# Patient Record
Sex: Female | Born: 1935 | Race: White | Hispanic: No | Marital: Married | Smoking: Never smoker
Health system: Southern US, Community
[De-identification: ages and names within clinical notes are randomized; demographics above are authoritative.]

## PROBLEM LIST (undated history)

## (undated) DIAGNOSIS — F32A Depression, unspecified: Secondary | ICD-10-CM

## (undated) DIAGNOSIS — I10 Essential (primary) hypertension: Secondary | ICD-10-CM

## (undated) DIAGNOSIS — F0280 Dementia in other diseases classified elsewhere without behavioral disturbance: Secondary | ICD-10-CM

## (undated) DIAGNOSIS — D649 Anemia, unspecified: Secondary | ICD-10-CM

## (undated) DIAGNOSIS — S72009A Fracture of unspecified part of neck of unspecified femur, initial encounter for closed fracture: Secondary | ICD-10-CM

## (undated) DIAGNOSIS — G309 Alzheimer's disease, unspecified: Secondary | ICD-10-CM

## (undated) DIAGNOSIS — F329 Major depressive disorder, single episode, unspecified: Secondary | ICD-10-CM

## (undated) DIAGNOSIS — E785 Hyperlipidemia, unspecified: Secondary | ICD-10-CM

## (undated) DIAGNOSIS — S72001A Fracture of unspecified part of neck of right femur, initial encounter for closed fracture: Secondary | ICD-10-CM

## (undated) HISTORY — PX: ABDOMINAL HYSTERECTOMY: SHX81

---

## 2015-11-25 ENCOUNTER — Emergency Department (HOSPITAL_COMMUNITY): Payer: PPO

## 2015-11-25 ENCOUNTER — Inpatient Hospital Stay (HOSPITAL_COMMUNITY)
Admission: EM | Admit: 2015-11-25 | Discharge: 2015-11-30 | DRG: 470 | Disposition: A | Discharge status: Skilled Nursing Facility | Payer: PPO | Attending: Internal Medicine | Admitting: Internal Medicine

## 2015-11-25 ENCOUNTER — Encounter (HOSPITAL_COMMUNITY): Payer: Self-pay | Admitting: Internal Medicine

## 2015-11-25 DIAGNOSIS — E785 Hyperlipidemia, unspecified: Secondary | ICD-10-CM | POA: Diagnosis present

## 2015-11-25 DIAGNOSIS — G309 Alzheimer's disease, unspecified: Secondary | ICD-10-CM | POA: Diagnosis not present

## 2015-11-25 DIAGNOSIS — S72002A Fracture of unspecified part of neck of left femur, initial encounter for closed fracture: Principal | ICD-10-CM | POA: Diagnosis present

## 2015-11-25 DIAGNOSIS — F0280 Dementia in other diseases classified elsewhere without behavioral disturbance: Secondary | ICD-10-CM | POA: Diagnosis present

## 2015-11-25 DIAGNOSIS — Z79899 Other long term (current) drug therapy: Secondary | ICD-10-CM

## 2015-11-25 DIAGNOSIS — R9431 Abnormal electrocardiogram [ECG] [EKG]: Secondary | ICD-10-CM | POA: Diagnosis present

## 2015-11-25 DIAGNOSIS — I10 Essential (primary) hypertension: Secondary | ICD-10-CM | POA: Diagnosis present

## 2015-11-25 DIAGNOSIS — F329 Major depressive disorder, single episode, unspecified: Secondary | ICD-10-CM | POA: Diagnosis not present

## 2015-11-25 DIAGNOSIS — F0281 Dementia in other diseases classified elsewhere with behavioral disturbance: Secondary | ICD-10-CM | POA: Diagnosis present

## 2015-11-25 DIAGNOSIS — Z9889 Other specified postprocedural states: Secondary | ICD-10-CM

## 2015-11-25 DIAGNOSIS — D62 Acute posthemorrhagic anemia: Secondary | ICD-10-CM | POA: Diagnosis not present

## 2015-11-25 DIAGNOSIS — F32A Depression, unspecified: Secondary | ICD-10-CM | POA: Diagnosis present

## 2015-11-25 DIAGNOSIS — Z7982 Long term (current) use of aspirin: Secondary | ICD-10-CM

## 2015-11-25 DIAGNOSIS — Z8781 Personal history of (healed) traumatic fracture: Secondary | ICD-10-CM

## 2015-11-25 DIAGNOSIS — W19XXXA Unspecified fall, initial encounter: Secondary | ICD-10-CM | POA: Diagnosis present

## 2015-11-25 HISTORY — DX: Major depressive disorder, single episode, unspecified: F32.9

## 2015-11-25 HISTORY — DX: Dementia in other diseases classified elsewhere, unspecified severity, without behavioral disturbance, psychotic disturbance, mood disturbance, and anxiety: F02.80

## 2015-11-25 HISTORY — DX: Essential (primary) hypertension: I10

## 2015-11-25 HISTORY — DX: Hyperlipidemia, unspecified: E78.5

## 2015-11-25 HISTORY — DX: Alzheimer's disease, unspecified: G30.9

## 2015-11-25 HISTORY — DX: Depression, unspecified: F32.A

## 2015-11-25 LAB — BASIC METABOLIC PANEL
ANION GAP: 10 (ref 5–15)
BUN: 13 mg/dL (ref 6–20)
CHLORIDE: 106 mmol/L (ref 101–111)
CO2: 24 mmol/L (ref 22–32)
CREATININE: 0.96 mg/dL (ref 0.44–1.00)
Calcium: 9.1 mg/dL (ref 8.9–10.3)
GFR calc non Af Amer: 54 mL/min — ABNORMAL LOW (ref >60)
Glucose, Bld: 89 mg/dL (ref 65–99)
Potassium: 4.2 mmol/L (ref 3.5–5.1)
SODIUM: 140 mmol/L (ref 135–145)

## 2015-11-25 LAB — PROTIME-INR
INR: 1.17 (ref 0.00–1.49)
PROTHROMBIN TIME: 15.1 s (ref 11.6–15.2)

## 2015-11-25 LAB — CBC
HCT: 39.2 % (ref 36.0–46.0)
HEMOGLOBIN: 12.5 g/dL (ref 12.0–15.0)
MCH: 29 pg (ref 26.0–34.0)
MCHC: 31.9 g/dL (ref 30.0–36.0)
MCV: 91 fL (ref 78.0–100.0)
Platelets: 314 10*3/uL (ref 150–400)
RBC: 4.31 MIL/uL (ref 3.87–5.11)
RDW: 13.4 % (ref 11.5–15.5)
WBC: 14.4 10*3/uL — AB (ref 4.0–10.5)

## 2015-11-25 LAB — APTT: APTT: 27 s (ref 24–37)

## 2015-11-25 MED ORDER — OXYCODONE-ACETAMINOPHEN 5-325 MG PO TABS
1.0000 | ORAL_TABLET | ORAL | Status: DC | PRN
Start: 2015-11-25 — End: 2015-11-26

## 2015-11-25 MED ORDER — MORPHINE SULFATE (PF) 4 MG/ML IV SOLN
4.0000 mg | Freq: Once | INTRAVENOUS | Status: AC
  Administered 2015-11-25: 4 mg via INTRAVENOUS
  Filled 2015-11-25: qty 1

## 2015-11-25 MED ORDER — ONDANSETRON HCL 4 MG/2ML IJ SOLN
4.0000 mg | Freq: Once | INTRAMUSCULAR | Status: AC
  Administered 2015-11-25: 4 mg via INTRAVENOUS
  Filled 2015-11-25: qty 2

## 2015-11-25 MED ORDER — ONDANSETRON HCL 4 MG/2ML IJ SOLN
4.0000 mg | Freq: Three times a day (TID) | INTRAMUSCULAR | Status: DC | PRN
Start: 2015-11-25 — End: 2015-11-26

## 2015-11-25 MED ORDER — MORPHINE SULFATE (PF) 2 MG/ML IV SOLN: 2.0000 mg | INTRAVENOUS | Status: DC | PRN

## 2015-11-25 MED ORDER — SODIUM CHLORIDE 0.9 % IV SOLN
INTRAVENOUS | Status: DC
  Administered 2015-11-26: 02:00:00 via INTRAVENOUS

## 2015-11-25 NOTE — ED Provider Notes (Signed)
CSN: [id number]     Arrival date & time 11/25/15  2111 History   First MD Initiated Contact with Patient 11/25/15 2138     Chief Complaint  Patient presents with  . Hip Pain     (Consider location/radiation/quality/duration/timing/severity/associated sxs/prior Treatment) HPI  A LEVEL 5 CAVEAT PERTAINS DUE TO DEMENTIA.  Per family patient had been taking the trash out to the road and fell.  She was found by neighbor lying at the curb.  She c/o left sided hip pain.  Pain is worse with movement and palpation.    Past Medical History  Diagnosis Date  . Alzheimer's disease   . Essential hypertension   . HLD (hyperlipidemia)   . Depression    Past Surgical History  Procedure Laterality Date  . Abdominal hysterectomy     No family history on file. Social History  Substance Use Topics  . Smoking status: Never Smoker   . Smokeless tobacco: Not on file  . Alcohol Use: No   OB History    No data available     Review of Systems  UNABLE TO OBTAIN ROS DUE TO LEVEL 5 CAVEAT    Allergies  Review of patient's allergies indicates no known allergies.  Home Medications   Prior to Admission medications   Medication Sig Start Date End Date Taking? Authorizing Provider  amLODipine (NORVASC) 5 MG tablet Take 5 mg by mouth daily.   Yes Historical Provider, MD  aspirin EC 81 MG tablet Take 81 mg by mouth daily.   Yes Historical Provider, MD  baclofen (LIORESAL) 10 MG tablet Take 1 tablet (10 mg total) by mouth 3 (three) times daily. As needed for muscle spasm 11/26/15   Teryl Lucy, MD  cholecalciferol (VITAMIN D) 1000 units tablet Take 2,000 Units by mouth daily.   Yes Historical Provider, MD  docusate sodium (COLACE) 100 MG capsule Take 100 mg by mouth daily.   Yes Historical Provider, MD  enoxaparin (LOVENOX) 40 MG/0.4ML injection Inject 0.4 mLs (40 mg total) into the skin daily. 11/26/15   Teryl Lucy, MD  FLUoxetine (PROZAC) 20 MG tablet Take 20 mg by mouth daily.   Yes Historical  Provider, MD  HYDROcodone-acetaminophen (NORCO) 5-325 MG tablet Take 1-2 tablets by mouth every 6 (six) hours as needed for moderate pain. MAXIMUM TOTAL ACETAMINOPHEN DOSE IS 4000 MG PER DAY 11/26/15   Teryl Lucy, MD  magnesium gluconate (MAGONATE) 500 MG tablet Take 500 mg by mouth daily.   Yes Historical Provider, MD  pravastatin (PRAVACHOL) 20 MG tablet Take 20 mg by mouth daily.   Yes Historical Provider, MD  rivastigmine (EXELON) 6 MG capsule Take 6 mg by mouth 2 (two) times daily.    Yes Historical Provider, MD  sennosides-docusate sodium (SENOKOT-S) 8.6-50 MG tablet Take 2 tablets by mouth daily. 11/26/15   Teryl Lucy, MD  vitamin B-12 (CYANOCOBALAMIN) 1000 MCG tablet Take 1,000 mcg by mouth daily.   Yes Historical Provider, MD   BP 133/56 mmHg  Pulse 72  Temp(Src) 98.8 F (37.1 C) (Oral)  Resp 23  Ht  (1.753 m)  Wt 60.1 kg  BMI 19.56 kg/m2  SpO2 100%  Vitals reviewed Physical Exam  Physical Examination: General appearance - alert, well appearing, and in no distress Mental status - alert, oriented to person, place, and time Eyes - no conjunctival injection no scleral icterus Head- NCAT Chest - clear to auscultation, no wheezes, rales or rhonchi, symmetric air entry Heart - normal rate, regular rhythm, normal  S1, S2, no murmurs, rubs, clicks or gallops Abdomen - soft, nontender, nondistended, no masses or organomegaly Back exam - full range of motion, no tenderness, palpable spasm or pain on motion Neurological - alert, oriented, normal speech Musculoskeletal - ttp over lateral left hip, pain with minimal attempts at internal and external rotation, otherrwise no joint tenderness, deformity or swelling Extremities - peripheral pulses normal, no pedal edema, no clubbing or cyanosis Skin - normal coloration and turgor, no rashes  ED Course  Procedures (including critical care time) Labs Review Labs Reviewed  CBC - Abnormal; Notable for the following:    WBC 14.4 (*)     All other components within normal limits  BASIC METABOLIC PANEL - Abnormal; Notable for the following:    GFR calc non Af Amer 54 (*)    All other components within normal limits  CBC - Abnormal; Notable for the following:    WBC 14.3 (*)    Hemoglobin 11.8 (*)    All other components within normal limits  BASIC METABOLIC PANEL - Abnormal; Notable for the following:    Glucose, Bld 129 (*)    Calcium 8.8 (*)    GFR calc non Af Amer 56 (*)    All other components within normal limits  LIPID PANEL - Abnormal; Notable for the following:    Triglycerides 202 (*)    HDL 35 (*)    LDL Cholesterol 104 (*)    All other components within normal limits  SURGICAL PCR SCREEN  PROTIME-INR  APTT  TROPONIN I  TROPONIN I  TROPONIN I  TYPE AND SCREEN  ABO/RH    Imaging Review Dg Chest 1 View  11/25/2015  CLINICAL DATA:  Status post fall, with left hip deformity. Concern for chest injury. Initial encounter. EXAM: CHEST 1 VIEW COMPARISON:  None. FINDINGS: The lungs are mildly hyperexpanded. Minimal bilateral atelectasis or scarring is noted. There is no evidence of pleural effusion or pneumothorax. The cardiomediastinal silhouette is borderline normal. No acute osseous abnormalities are seen. IMPRESSION: Minimal bilateral atelectasis or scarring noted. Lungs otherwise clear. Electronically Signed   By: Roanna RaiderJeffery  Chang M.D.   On: 11/25/2015 23:09   Ct Head Wo Contrast  11/26/2015  CLINICAL DATA:  Altered mental status.  Confusion and combative. EXAM: CT HEAD WITHOUT CONTRAST TECHNIQUE: Contiguous axial images were obtained from the base of the skull through the vertex without intravenous contrast. COMPARISON:  None. FINDINGS: Brain: Generalized cerebral atrophy, mild- moderate chronic small vessel ischemia.No intracranial hemorrhage, mass effect, or midline shift. No hydrocephalus. The basilar cisterns are patent. No evidence of territorial infarct. No intracranial fluid collection. Vascular: No  hyperdense vessel or abnormal calcification. Atherosclerosis of skullbase vasculature. Skull:  Calvarium is intact. Sinuses/Orbits: Minimal mucosal thickening of the inferior right maxillary sinus. Paranasal sinuses are otherwise clear. Mastoid air cells are well aerated. Other: None. IMPRESSION: Atrophy and chronic small vessel ischemia, no acute intracranial abnormality. Electronically Signed   By: Rubye OaksMelanie  Ehinger M.D.   On: 11/26/2015 03:45   Pelvis Portable  11/26/2015  CLINICAL DATA:  Bipolar left hip arthroplasty EXAM: PORTABLE PELVIS 1-2 VIEWS COMPARISON:  None. FINDINGS: Single view shows bipolar hip arthroplasty on the left. Components appear well positioned. No radiographically detectable complication. IMPRESSION: Good appearance following left hip arthroplasty. Electronically Signed   By: Paulina FusiMark  Shogry M.D.   On: 11/26/2015 10:34   Dg Hip Unilat With Pelvis 2-3 Views Left  11/25/2015  CLINICAL DATA:  Status post unwitnessed fall, with left hip pain.  Initial encounter. EXAM: DG HIP (WITH OR WITHOUT PELVIS) 2-3V LEFT COMPARISON:  None. FINDINGS: There is a mildly displaced subcapital fracture through the left femoral neck. The left femoral head remains seated at the acetabulum. The right hip joint is grossly unremarkable. No significant degenerative change is appreciated. The sacroiliac joints are unremarkable in appearance. The visualized bowel gas pattern is grossly unremarkable in appearance. IMPRESSION: Mildly displaced subcapital fracture through the left femoral neck. Electronically Signed   By: Roanna Raider M.D.   On: 11/25/2015 23:08   I have personally reviewed and evaluated these images and lab results as part of my medical decision-making.   EKG Interpretation None      MDM   Final diagnoses:  Hip fracture, left, closed, initial encounter (HCC)  Fall, initial encounter  Essential hypertension  HLD (hyperlipidemia)  Depression    Pt presenting after fall with left hip  pain. Xray obtained and shows left femoral neck fracture.  No other signs of trauma on exam.  No signs of head injury.  Pt treated with pain meds.  Ortho consult and medical admission.   11:41 PM d/w Dr. Dion Saucier, orthopedics, will admit to medical service.    11:45 PM d/w Dr. Clyde Lundborg, [facility] for [facility].  Pt to go to med/surg obs bed.    Jerelyn Scott, MD 11/26/15 [phone]

## 2015-11-25 NOTE — H&P (Signed)
History and Physical    Kristen Nguyen WUJ:[id number] DOB: July 01, 1933 DOA: 11/25/2015  Referring MD/NP/PA:   PCP: No primary care provider on file.   Patient coming from:  The patient is coming from home.  At baseline, pt is dependent for most of ADL.       Chief Complaint: left hip pain after fall.  HPI: Kristen Nguyen is a 82 y.o. female with medical history significant of dementia, hypertension, hyperlipidemia, depression, who presents with left hip pain after fall.   Patient has dementia and is unable to provide medical history, therefore, the history is obtained by discussing the case with family, per EMS report, and with the nursing staff.  Per family patient had been taking the trash out to the road and fell at about 5:30 PM. She was found by neighbor lying at the curb. She developed left sided hip pain. Pain is worse with movement and palpation. Per family, pt does not seem to have chest pain, shortness of breath, cough, nausea, vomiting, diarrhea, unilateral weakness. Her mental status is at baseline. She is not oriented 3 at baseline.  ED Course: pt was found to have WBC 14.4, temperature normal, bradycardia, electrolytes and renal function okay. Chest x-ray showed bilateral atelectasis. CT-head is negative for acute intracranial abnormalities. X-ray of pelvis/hip showed mildly displaced subcapital fracture through the left femoral neck. Ortho, Dr. Dion Saucier was consulted.  Review of Systems: Could not be reviewed due to dementia.  Allergy: No Known Allergies  Past Medical History  Diagnosis Date  . Alzheimer's disease   . Essential hypertension   . HLD (hyperlipidemia)   . Depression     Past Surgical History  Procedure Laterality Date  . Abdominal hysterectomy       Social History:  reports that she has never smoked. She does not have any smokeless tobacco history on file. She reports that she does not drink alcohol or use illicit drugs. Could not be reviewed due to  dementia.  Family History: No family history on file. Could not be reviewed due to dementia.  Prior to Admission medications   Not on File    Physical Exam: Filed Vitals:   11/25/15 2128 11/25/15 2200 11/26/15 0132 11/26/15 0526  BP: 152/65 111/75 121/72 125/49  Pulse: 57 69 125   Temp: 98.5 F (36.9 C)  99.3 F (37.4 C) 98.6 F (37 C)  TempSrc: Oral  Oral Oral  Resp: Height:      Weight:   60.1 kg (132 lb 7.9 oz)   SpO2: 100% 100% 93% 98%   General: Not in acute distress HEENT:       Eyes: PERRL, EOMI, no scleral icterus.       ENT: No discharge from the ears and nose, no pharynx injection, no tonsillar enlargement.        Neck: No JVD, no bruit, no mass felt. Heme: No neck lymph node enlargement. Cardiac: S1/S2, RRR, No murmurs, No gallops or rubs. Pulm:  No rales, wheezing, rhonchi or rubs. Abd: Soft, nondistended, nontender, no rebound pain, no organomegaly, BS present. GU: No hematuria Ext: No pitting leg edema bilaterally. 2+DP/PT pulse bilaterally. Musculoskeletal: has tenderness over left hip and later thigh. Skin: No rashes.  Neuro: Alert, oriented X3, cranial nerves II-XII grossly intact, moves all extremities normally. Psych: Could not be reviewed due to dementia  Labs on Admission: I have personally reviewed following labs and imaging studies  CBC:  Recent Labs Lab 11/25/15 2215 11/26/15  0221  WBC 14.4* 14.3*  HGB 12.5 11.8*  HCT 39.2 37.5  MCV 91.0 91.0  PLT 314 294   Basic Metabolic Panel:  Recent Labs Lab 11/25/15 2215 11/26/15 0221  NA 140 140  K 4.2 3.8  CL 106 108  CO2 24 26  GLUCOSE 89 129*  BUN 13 13  CREATININE 0.96 0.94  CALCIUM 9.1 8.8*   GFR: Estimated Creatinine Clearance: 45.3 mL/min (by C-G formula based on Cr of 0.94). Liver Function Tests: No results for input(s): AST, ALT, ALKPHOS, BILITOT, PROT, ALBUMIN in the last 168 hours. No results for input(s): LIPASE, AMYLASE in the last 168 hours. No results for  input(s): AMMONIA in the last 168 hours. Coagulation Profile:  Recent Labs Lab 11/25/15 2215  INR 1.17   Cardiac Enzymes:  Recent Labs Lab 11/26/15 0359  TROPONINI <0.03   BNP (last 3 results) No results for input(s): PROBNP in the last 8760 hours. HbA1C: No results for input(s): HGBA1C in the last 72 hours. CBG: No results for input(s): GLUCAP in the last 168 hours. Lipid Profile:  Recent Labs  11/26/15 0211  CHOL 179  HDL 35*  LDLCALC 104*  TRIG 202*  CHOLHDL 5.1   Thyroid Function Tests: No results for input(s): TSH, T4TOTAL, FREET4, T3FREE, THYROIDAB in the last 72 hours. Anemia Panel: No results for input(s): VITAMINB12, FOLATE, FERRITIN, TIBC, IRON, RETICCTPCT in the last 72 hours. Urine analysis: No results found for: COLORURINE, APPEARANCEUR, LABSPEC, PHURINE, GLUCOSEU, HGBUR, BILIRUBINUR, KETONESUR, PROTEINUR, UROBILINOGEN, NITRITE, LEUKOCYTESUR Sepsis Labs: @LABRCNTIP (procalcitonin:4,lacticidven:4) ) Recent Results (from the past 240 hour(s))  Surgical pcr screen     Status: None   Collection Time: 11/26/15  2:00 AM  Result Value Ref Range Status   MRSA, PCR NEGATIVE NEGATIVE Final   Staphylococcus aureus NEGATIVE NEGATIVE Final    Comment:        The Xpert SA Assay (FDA approved for NASAL specimens in patients over 32 years of age), is one component of a comprehensive surveillance program.  Test performance has been validated by [facility] for patients greater than or equal to 40 year old. It is not intended to diagnose infection nor to guide or monitor treatment.      Radiological Exams on Admission: Dg Chest 1 View  11/25/2015  CLINICAL DATA:  Status post fall, with left hip deformity. Concern for chest injury. Initial encounter. EXAM: CHEST 1 VIEW COMPARISON:  None. FINDINGS: The lungs are mildly hyperexpanded. Minimal bilateral atelectasis or scarring is noted. There is no evidence of pleural effusion or pneumothorax. The  cardiomediastinal silhouette is borderline normal. No acute osseous abnormalities are seen. IMPRESSION: Minimal bilateral atelectasis or scarring noted. Lungs otherwise clear. Electronically Signed   By: Roanna Raider M.D.   On: 11/25/2015 23:09   Ct Head Wo Contrast  11/26/2015  CLINICAL DATA:  Altered mental status.  Confusion and combative. EXAM: CT HEAD WITHOUT CONTRAST TECHNIQUE: Contiguous axial images were obtained from the base of the skull through the vertex without intravenous contrast. COMPARISON:  None. FINDINGS: Brain: Generalized cerebral atrophy, mild- moderate chronic small vessel ischemia.No intracranial hemorrhage, mass effect, or midline shift. No hydrocephalus. The basilar cisterns are patent. No evidence of territorial infarct. No intracranial fluid collection. Vascular: No hyperdense vessel or abnormal calcification. Atherosclerosis of skullbase vasculature. Skull:  Calvarium is intact. Sinuses/Orbits: Minimal mucosal thickening of the inferior right maxillary sinus. Paranasal sinuses are otherwise clear. Mastoid air cells are well aerated. Other: None. IMPRESSION: Atrophy and chronic small vessel ischemia,  no acute intracranial abnormality. Electronically Signed   By: Rubye OaksMelanie  Ehinger M.D.   On: 11/26/2015 03:45   Dg Hip Unilat With Pelvis 2-3 Views Left  11/25/2015  CLINICAL DATA:  Status post unwitnessed fall, with left hip pain. Initial encounter. EXAM: DG HIP (WITH OR WITHOUT PELVIS) 2-3V LEFT COMPARISON:  None. FINDINGS: There is a mildly displaced subcapital fracture through the left femoral neck. The left femoral head remains seated at the acetabulum. The right hip joint is grossly unremarkable. No significant degenerative change is appreciated. The sacroiliac joints are unremarkable in appearance. The visualized bowel gas pattern is grossly unremarkable in appearance. IMPRESSION: Mildly displaced subcapital fracture through the left femoral neck. Electronically Signed   By:  Roanna RaiderJeffery  Chang M.D.   On: 11/25/2015 23:08     EKG: Independently reviewed. QTC 511, left bundle blockade, no old EKG to compare.  Assessment/Plan Principal Problem:   Closed left hip fracture (HCC) Active Problems:   Alzheimer's disease   Fall   Essential hypertension   HLD (hyperlipidemia)   Depression   Abnormal EKG  Closed left hip fracture (HCC):  As evidenced by x-ray. Patient has pain now. No neurovascular compromise. Orthopedic surgeon was consulted. Dr. Evonnie DawesLaudau will see pt   - will admit to Med-surg bed-->due to agitation, she had tachycardia-->put cardiac monitor - Pain control: morphine prn and percocet - When necessary hydroxyzine for nausea - f/u ortho recommendations - type and cross - INR/PTT  Leukocytosis: Likely due to stress-induced demargination. Patient does not have signs of infection. -Follow-up CBC  Dementia:  -continue Eexlon -prn ativan for agitaiton  Essential hypertension: -continue amlodipine -IV hydralazine when necessary  HLD: Last LDL was not on record -Continue home medications: Pravastatin -Check FLP  Depression: -Continue Prozac  Abnormal EKG: EKG showed left bundle blockade, no old EKG to compare with. Patient does not seem to have chest pain, less likely to have heat attack. -Troponin 3 -Start aspirin   DVT ppx: SCD Code Status: Full code Family Communication: Yes, patient's husband and her daughter-in-law at bed side Disposition Plan:  Anticipate discharge back to previous home environment Consults called:  Ortho, Dr. Dion SaucierLandau Admission status: Obs / tele      Date of Service 11/26/2015    Lorretta HarpIU, Enrica Corliss [facility] Pager [phone]  If 7PM-7AM, please contact night-coverage [email or web address] Password [email or web address] 11/26/2015, 6:08 AM

## 2015-11-25 NOTE — Progress Notes (Addendum)
Called regarding left hip fracture.   Films reviewed, will likely be best treated with hemiarthroplasty.    Plan surgery tomorrow 7:30am, full consult to follow in am. Spoken with family and communicated the plan.    Eulas PostLANDAU,Demarko Zeimet P, MD

## 2015-11-25 NOTE — ED Notes (Addendum)
Per [facility]   Pt coming from home  Unwitnessed fall. Family assisted pt to a chair.  Pt has alzheimer. Oriented to her normal.  Left leg pain. Points to femur and hip for pain . No bruising, shortness, no rotations. Tender to palpation. No LOC  20LFA NSR

## 2015-11-26 ENCOUNTER — Observation Stay (HOSPITAL_COMMUNITY): Payer: PPO

## 2015-11-26 ENCOUNTER — Encounter (HOSPITAL_COMMUNITY)
Admission: EM | Disposition: A | Discharge status: Skilled Nursing Facility | Payer: Self-pay | Source: Home / Self Care | Attending: Internal Medicine

## 2015-11-26 ENCOUNTER — Observation Stay (HOSPITAL_COMMUNITY): Payer: PPO | Admitting: Anesthesiology

## 2015-11-26 ENCOUNTER — Encounter (HOSPITAL_COMMUNITY): Payer: Self-pay | Admitting: Internal Medicine

## 2015-11-26 DIAGNOSIS — G308 Other Alzheimer's disease: Secondary | ICD-10-CM | POA: Diagnosis not present

## 2015-11-26 DIAGNOSIS — I10 Essential (primary) hypertension: Secondary | ICD-10-CM | POA: Diagnosis not present

## 2015-11-26 DIAGNOSIS — F329 Major depressive disorder, single episode, unspecified: Secondary | ICD-10-CM | POA: Diagnosis not present

## 2015-11-26 DIAGNOSIS — E785 Hyperlipidemia, unspecified: Secondary | ICD-10-CM | POA: Diagnosis not present

## 2015-11-26 DIAGNOSIS — S72002A Fracture of unspecified part of neck of left femur, initial encounter for closed fracture: Secondary | ICD-10-CM | POA: Diagnosis present

## 2015-11-26 DIAGNOSIS — D62 Acute posthemorrhagic anemia: Secondary | ICD-10-CM | POA: Diagnosis not present

## 2015-11-26 DIAGNOSIS — F32A Depression, unspecified: Secondary | ICD-10-CM | POA: Diagnosis present

## 2015-11-26 DIAGNOSIS — R9431 Abnormal electrocardiogram [ECG] [EKG]: Secondary | ICD-10-CM | POA: Diagnosis not present

## 2015-11-26 DIAGNOSIS — F0281 Dementia in other diseases classified elsewhere with behavioral disturbance: Secondary | ICD-10-CM

## 2015-11-26 HISTORY — PX: HIP ARTHROPLASTY: SHX981

## 2015-11-26 LAB — CBC
HCT: 37.5 % (ref 36.0–46.0)
HEMOGLOBIN: 11.8 g/dL — AB (ref 12.0–15.0)
MCH: 28.6 pg (ref 26.0–34.0)
MCHC: 31.5 g/dL (ref 30.0–36.0)
MCV: 91 fL (ref 78.0–100.0)
Platelets: 294 10*3/uL (ref 150–400)
RBC: 4.12 MIL/uL (ref 3.87–5.11)
RDW: 13.6 % (ref 11.5–15.5)
WBC: 14.3 10*3/uL — ABNORMAL HIGH (ref 4.0–10.5)

## 2015-11-26 LAB — BASIC METABOLIC PANEL
Anion gap: 6 (ref 5–15)
BUN: 13 mg/dL (ref 6–20)
CHLORIDE: 108 mmol/L (ref 101–111)
CO2: 26 mmol/L (ref 22–32)
CREATININE: 0.94 mg/dL (ref 0.44–1.00)
Calcium: 8.8 mg/dL — ABNORMAL LOW (ref 8.9–10.3)
GFR calc Af Amer: >60 mL/min (ref >60)
GFR calc non Af Amer: 56 mL/min — ABNORMAL LOW (ref >60)
GLUCOSE: 129 mg/dL — AB (ref 65–99)
Potassium: 3.8 mmol/L (ref 3.5–5.1)
SODIUM: 140 mmol/L (ref 135–145)

## 2015-11-26 LAB — TYPE AND SCREEN
ABO/RH(D): AB POS
ANTIBODY SCREEN: NEGATIVE

## 2015-11-26 LAB — ABO/RH: ABO/RH(D): AB POS

## 2015-11-26 LAB — LIPID PANEL
CHOLESTEROL: 179 mg/dL (ref 0–200)
HDL: 35 mg/dL — ABNORMAL LOW (ref >40)
LDL CALC: 104 mg/dL — AB (ref 0–99)
Total CHOL/HDL Ratio: 5.1 RATIO
Triglycerides: 202 mg/dL — ABNORMAL HIGH (ref <150)
VLDL: 40 mg/dL (ref 0–40)

## 2015-11-26 LAB — TROPONIN I
Troponin I: <0.03 ng/mL (ref <0.031)
Troponin I: <0.03 ng/mL (ref <0.031)
Troponin I: <0.03 ng/mL (ref <0.031)

## 2015-11-26 LAB — SURGICAL PCR SCREEN
MRSA, PCR: NEGATIVE
STAPHYLOCOCCUS AUREUS: NEGATIVE

## 2015-11-26 SURGERY — HEMIARTHROPLASTY, HIP, DIRECT ANTERIOR APPROACH, FOR FRACTURE
Anesthesia: General | Laterality: Left

## 2015-11-26 MED ORDER — HYDROCODONE-ACETAMINOPHEN 5-325 MG PO TABS: 1.0000 | ORAL_TABLET | Freq: Four times a day (QID) | ORAL | Status: DC | PRN

## 2015-11-26 MED ORDER — CEFAZOLIN SODIUM-DEXTROSE 2-4 GM/100ML-% IV SOLN
2.0000 g | INTRAVENOUS | Status: DC
  Administered 2015-11-26: 2 g via INTRAVENOUS
  Filled 2015-11-26: qty 100

## 2015-11-26 MED ORDER — SODIUM CHLORIDE 0.9 % IV SOLN
75.0000 mL/h | INTRAVENOUS | Status: DC
  Administered 2015-11-26 – 2015-11-30 (×4): 75 mL/h via INTRAVENOUS

## 2015-11-26 MED ORDER — LACTATED RINGERS IV SOLN
INTRAVENOUS | Status: DC | PRN
  Administered 2015-11-26: 07:00:00 via INTRAVENOUS

## 2015-11-26 MED ORDER — LORAZEPAM 2 MG/ML IJ SOLN
0.5000 mg | INTRAMUSCULAR | Status: DC | PRN
  Administered 2015-11-26 – 2015-11-30 (×4): 0.5 mg via INTRAVENOUS
  Filled 2015-11-26 (×4): qty 1

## 2015-11-26 MED ORDER — ENOXAPARIN SODIUM 40 MG/0.4ML ~~LOC~~ SOLN
40.0000 mg | SUBCUTANEOUS | Status: DC
  Administered 2015-11-27 – 2015-11-30 (×4): 40 mg via SUBCUTANEOUS
  Filled 2015-11-26 (×4): qty 0.4

## 2015-11-26 MED ORDER — ROCURONIUM BROMIDE 100 MG/10ML IV SOLN
INTRAVENOUS | Status: DC | PRN
  Administered 2015-11-26: 35 mg via INTRAVENOUS

## 2015-11-26 MED ORDER — ASPIRIN EC 81 MG PO TBEC: 81.0000 mg | DELAYED_RELEASE_TABLET | Freq: Every day | ORAL | Status: DC

## 2015-11-26 MED ORDER — DOCUSATE SODIUM 100 MG PO CAPS
100.0000 mg | ORAL_CAPSULE | Freq: Two times a day (BID) | ORAL | Status: DC
  Administered 2015-11-28 – 2015-11-30 (×4): 100 mg via ORAL
  Filled 2015-11-26 (×6): qty 1

## 2015-11-26 MED ORDER — POLYETHYLENE GLYCOL 3350 17 G PO PACK: 17.0000 g | PACK | Freq: Every day | ORAL | Status: DC | PRN

## 2015-11-26 MED ORDER — SENNA-DOCUSATE SODIUM 8.6-50 MG PO TABS: 2.0000 | ORAL_TABLET | Freq: Every day | ORAL | Status: AC

## 2015-11-26 MED ORDER — FENTANYL CITRATE (PF) 250 MCG/5ML IJ SOLN
INTRAMUSCULAR | Status: AC
  Filled 2015-11-26: qty 5

## 2015-11-26 MED ORDER — CARVEDILOL 3.125 MG PO TABS: 3.1250 mg | ORAL_TABLET | Freq: Two times a day (BID) | ORAL | Status: DC

## 2015-11-26 MED ORDER — BUPIVACAINE HCL (PF) 0.25 % IJ SOLN
INTRAMUSCULAR | Status: AC
Start: 2015-11-26 — End: 2015-11-26
  Filled 2015-11-26: qty 30

## 2015-11-26 MED ORDER — ALUM & MAG HYDROXIDE-SIMETH 200-200-20 MG/5ML PO SUSP: 30.0000 mL | ORAL | Status: DC | PRN

## 2015-11-26 MED ORDER — MORPHINE SULFATE (PF) 2 MG/ML IV SOLN
2.0000 mg | INTRAVENOUS | Status: DC | PRN
  Administered 2015-11-26 – 2015-11-29 (×10): 2 mg via INTRAVENOUS
  Filled 2015-11-26 (×10): qty 1

## 2015-11-26 MED ORDER — BISACODYL 10 MG RE SUPP: 10.0000 mg | Freq: Every day | RECTAL | Status: DC | PRN

## 2015-11-26 MED ORDER — FENTANYL CITRATE (PF) 100 MCG/2ML IJ SOLN: 25.0000 ug | INTRAMUSCULAR | Status: DC | PRN

## 2015-11-26 MED ORDER — ACETAMINOPHEN 325 MG PO TABS: 650.0000 mg | ORAL_TABLET | Freq: Four times a day (QID) | ORAL | Status: DC | PRN

## 2015-11-26 MED ORDER — BACLOFEN 10 MG PO TABS: 10.0000 mg | ORAL_TABLET | Freq: Three times a day (TID) | ORAL | Status: DC

## 2015-11-26 MED ORDER — SENNA 8.6 MG PO TABS
1.0000 | ORAL_TABLET | Freq: Two times a day (BID) | ORAL | Status: DC
  Administered 2015-11-28 – 2015-11-30 (×4): 8.6 mg via ORAL
  Filled 2015-11-26 (×7): qty 1

## 2015-11-26 MED ORDER — PHENOL 1.4 % MT LIQD: 1.0000 | OROMUCOSAL | Status: DC | PRN

## 2015-11-26 MED ORDER — BUPIVACAINE HCL (PF) 0.25 % IJ SOLN
INTRAMUSCULAR | Status: DC | PRN
  Administered 2015-11-26: 10 mL

## 2015-11-26 MED ORDER — PROPOFOL 10 MG/ML IV BOLUS
INTRAVENOUS | Status: DC | PRN
  Administered 2015-11-26: 70 mg via INTRAVENOUS

## 2015-11-26 MED ORDER — ENSURE ENLIVE PO LIQD
237.0000 mL | Freq: Two times a day (BID) | ORAL | Status: DC
  Administered 2015-11-28 – 2015-11-30 (×3): 237 mL via ORAL

## 2015-11-26 MED ORDER — MAGNESIUM CITRATE PO SOLN: 1.0000 | Freq: Once | ORAL | Status: DC | PRN

## 2015-11-26 MED ORDER — QUETIAPINE 12.5 MG HALF TABLET
12.5000 mg | ORAL_TABLET | Freq: Every day | ORAL | Status: DC
  Administered 2015-11-29: 12.5 mg via ORAL
  Filled 2015-11-26 (×4): qty 1

## 2015-11-26 MED ORDER — CEFAZOLIN SODIUM-DEXTROSE 2-4 GM/100ML-% IV SOLN
2.0000 g | Freq: Four times a day (QID) | INTRAVENOUS | Status: AC
  Administered 2015-11-26 (×2): 2 g via INTRAVENOUS
  Filled 2015-11-26 (×3): qty 100

## 2015-11-26 MED ORDER — PROPOFOL 10 MG/ML IV BOLUS
INTRAVENOUS | Status: AC
  Filled 2015-11-26: qty 20

## 2015-11-26 MED ORDER — LORAZEPAM 2 MG/ML IJ SOLN
0.5000 mg | INTRAMUSCULAR | Status: DC | PRN
  Administered 2015-11-26: 0.5 mg via INTRAVENOUS
  Filled 2015-11-26: qty 1

## 2015-11-26 MED ORDER — CHLORHEXIDINE GLUCONATE 4 % EX LIQD
60.0000 mL | Freq: Once | CUTANEOUS | Status: AC
  Administered 2015-11-26: 4 via TOPICAL
  Filled 2015-11-26: qty 60

## 2015-11-26 MED ORDER — HYDROXYZINE HCL 50 MG/ML IM SOLN
25.0000 mg | Freq: Four times a day (QID) | INTRAMUSCULAR | Status: DC | PRN
  Filled 2015-11-26: qty 0.5

## 2015-11-26 MED ORDER — DOCUSATE SODIUM 100 MG PO CAPS: 100.0000 mg | ORAL_CAPSULE | Freq: Two times a day (BID) | ORAL | Status: DC | PRN

## 2015-11-26 MED ORDER — ONDANSETRON HCL 4 MG/2ML IJ SOLN: 4.0000 mg | Freq: Four times a day (QID) | INTRAMUSCULAR | Status: DC | PRN

## 2015-11-26 MED ORDER — HYDRALAZINE HCL 20 MG/ML IJ SOLN: 5.0000 mg | INTRAMUSCULAR | Status: DC | PRN

## 2015-11-26 MED ORDER — DEXTROSE 5 % IV SOLN
10.0000 mg | INTRAVENOUS | Status: DC | PRN
  Administered 2015-11-26: 15 ug/min via INTRAVENOUS

## 2015-11-26 MED ORDER — ONDANSETRON HCL 4 MG/2ML IJ SOLN
INTRAMUSCULAR | Status: DC | PRN
  Administered 2015-11-26: 4 mg via INTRAVENOUS

## 2015-11-26 MED ORDER — AMLODIPINE BESYLATE 5 MG PO TABS
5.0000 mg | ORAL_TABLET | Freq: Every day | ORAL | Status: DC
  Administered 2015-11-27 – 2015-11-30 (×4): 5 mg via ORAL
  Filled 2015-11-26 (×4): qty 1

## 2015-11-26 MED ORDER — SODIUM CHLORIDE 0.9 % IR SOLN
Status: DC | PRN
  Administered 2015-11-26: 1000 mL

## 2015-11-26 MED ORDER — POVIDONE-IODINE 10 % EX SWAB
2.0000 "application " | Freq: Once | CUTANEOUS | Status: AC
  Administered 2015-11-26: 2 via TOPICAL

## 2015-11-26 MED ORDER — ONDANSETRON HCL 4 MG/2ML IJ SOLN: 4.0000 mg | Freq: Once | INTRAMUSCULAR | Status: DC | PRN

## 2015-11-26 MED ORDER — FERROUS SULFATE 325 (65 FE) MG PO TABS
325.0000 mg | ORAL_TABLET | Freq: Three times a day (TID) | ORAL | Status: DC
  Administered 2015-11-28 – 2015-11-30 (×4): 325 mg via ORAL
  Filled 2015-11-26 (×5): qty 1

## 2015-11-26 MED ORDER — SUGAMMADEX SODIUM 200 MG/2ML IV SOLN
INTRAVENOUS | Status: DC | PRN
  Administered 2015-11-26: 200 mg via INTRAVENOUS

## 2015-11-26 MED ORDER — RIVASTIGMINE TARTRATE 1.5 MG PO CAPS
6.0000 mg | ORAL_CAPSULE | Freq: Every day | ORAL | Status: DC
  Administered 2015-11-30: 6 mg via ORAL
  Filled 2015-11-26 (×4): qty 4
  Filled 2015-11-26: qty 2

## 2015-11-26 MED ORDER — PRAVASTATIN SODIUM 20 MG PO TABS: 20.0000 mg | ORAL_TABLET | Freq: Every day | ORAL | Status: DC

## 2015-11-26 MED ORDER — ENOXAPARIN SODIUM 40 MG/0.4ML ~~LOC~~ SOLN: 40.0000 mg | SUBCUTANEOUS | Status: DC

## 2015-11-26 MED ORDER — LIDOCAINE HCL (CARDIAC) 20 MG/ML IV SOLN
INTRAVENOUS | Status: DC | PRN
  Administered 2015-11-26: 100 mg via INTRAVENOUS

## 2015-11-26 MED ORDER — FENTANYL CITRATE (PF) 100 MCG/2ML IJ SOLN
INTRAMUSCULAR | Status: DC | PRN
  Administered 2015-11-26: 50 ug via INTRAVENOUS

## 2015-11-26 MED ORDER — MENTHOL 3 MG MT LOZG: 1.0000 | LOZENGE | OROMUCOSAL | Status: DC | PRN

## 2015-11-26 MED ORDER — QUETIAPINE 12.5 MG HALF TABLET: 12.5000 mg | ORAL_TABLET | Freq: Two times a day (BID) | ORAL | Status: DC

## 2015-11-26 MED ORDER — VITAMIN B-12 1000 MCG PO TABS
1000.0000 ug | ORAL_TABLET | Freq: Every day | ORAL | Status: DC
  Administered 2015-11-28 – 2015-11-30 (×3): 1000 ug via ORAL
  Filled 2015-11-26 (×4): qty 1

## 2015-11-26 MED ORDER — ACETAMINOPHEN 650 MG RE SUPP: 650.0000 mg | Freq: Four times a day (QID) | RECTAL | Status: DC | PRN

## 2015-11-26 MED ORDER — VITAMIN D 1000 UNITS PO TABS
1000.0000 [IU] | ORAL_TABLET | Freq: Every day | ORAL | Status: DC
  Administered 2015-11-29 – 2015-11-30 (×2): 1000 [IU] via ORAL
  Filled 2015-11-26 (×3): qty 1

## 2015-11-26 MED ORDER — ONDANSETRON HCL 4 MG PO TABS: 4.0000 mg | ORAL_TABLET | Freq: Four times a day (QID) | ORAL | Status: DC | PRN

## 2015-11-26 MED ORDER — FLUOXETINE HCL 20 MG PO CAPS
20.0000 mg | ORAL_CAPSULE | Freq: Every day | ORAL | Status: DC
  Administered 2015-11-29 – 2015-11-30 (×2): 20 mg via ORAL
  Filled 2015-11-26 (×3): qty 1

## 2015-11-26 SURGICAL SUPPLY — 58 items
BENZOIN TINCTURE PRP APPL 2/3 (GAUZE/BANDAGES/DRESSINGS) ×3 IMPLANT
BLADE SAW SGTL 18.5X63.X.64 HD (BLADE) ×3 IMPLANT
BRUSH FEMORAL CANAL (MISCELLANEOUS) IMPLANT
CAPT HIP HEMI 1 ×3 IMPLANT
CLOSURE STERI-STRIP 1/2X4 (GAUZE/BANDAGES/DRESSINGS) ×1
CLSR STERI-STRIP ANTIMIC 1/2X4 (GAUZE/BANDAGES/DRESSINGS) ×2 IMPLANT
COVER BACK TABLE 24X17X13 BIG (DRAPES) IMPLANT
COVER SURGICAL LIGHT HANDLE (MISCELLANEOUS) ×3 IMPLANT
DRAPE IMP U-DRAPE 54X76 (DRAPES) ×3 IMPLANT
DRAPE INCISE IOBAN 66X45 STRL (DRAPES) IMPLANT
DRAPE ORTHO SPLIT 77X108 STRL (DRAPES) ×4
DRAPE SURG ORHT 6 SPLT 77X108 (DRAPES) ×2 IMPLANT
DRAPE U-SHAPE 47X51 STRL (DRAPES) ×3 IMPLANT
DRILL BIT 5/64 (BIT) ×3 IMPLANT
DRSG MEPILEX BORDER 4X12 (GAUZE/BANDAGES/DRESSINGS) ×3 IMPLANT
DRSG MEPILEX BORDER 4X8 (GAUZE/BANDAGES/DRESSINGS) IMPLANT
DURAPREP 26ML APPLICATOR (WOUND CARE) ×3 IMPLANT
ELECT BLADE 6.5 EXT (BLADE) IMPLANT
ELECT CAUTERY BLADE 6.4 (BLADE) ×3 IMPLANT
ELECT REM PT RETURN 9FT ADLT (ELECTROSURGICAL) ×3
ELECTRODE REM PT RTRN 9FT ADLT (ELECTROSURGICAL) ×1 IMPLANT
FACESHIELD WRAPAROUND (MASK) ×6 IMPLANT
GLOVE BIOGEL PI ORTHO PRO SZ8 (GLOVE) ×2
GLOVE ORTHO TXT STRL SZ7.5 (GLOVE) ×3 IMPLANT
GLOVE PI ORTHO PRO STRL SZ8 (GLOVE) ×1 IMPLANT
GLOVE SURG ORTHO 8.0 STRL STRW (GLOVE) ×6 IMPLANT
GOWN STRL REUS W/ TWL XL LVL3 (GOWN DISPOSABLE) ×1 IMPLANT
GOWN STRL REUS W/TWL 2XL LVL3 (GOWN DISPOSABLE) ×3 IMPLANT
GOWN STRL REUS W/TWL XL LVL3 (GOWN DISPOSABLE) ×2
HANDPIECE INTERPULSE COAX TIP (DISPOSABLE)
KIT BASIN OR (CUSTOM PROCEDURE TRAY) ×3 IMPLANT
KIT ROOM TURNOVER OR (KITS) ×3 IMPLANT
MANIFOLD NEPTUNE II (INSTRUMENTS) ×3 IMPLANT
NDL SUT 6 .5 CRC .975X.05 MAYO (NEEDLE) IMPLANT
NEEDLE 22X1 1/2 (OR ONLY) (NEEDLE) ×3 IMPLANT
NEEDLE MAYO TAPER (NEEDLE)
NS IRRIG 1000ML POUR BTL (IV SOLUTION) ×3 IMPLANT
PACK TOTAL JOINT (CUSTOM PROCEDURE TRAY) ×3 IMPLANT
PACK UNIVERSAL I (CUSTOM PROCEDURE TRAY) ×3 IMPLANT
PAD ARMBOARD 7.5X6 YLW CONV (MISCELLANEOUS) ×6 IMPLANT
PILLOW ABDUCTION HIP (SOFTGOODS) ×3 IMPLANT
PRESSURIZER FEMORAL UNIV (MISCELLANEOUS) IMPLANT
RETRIEVER SUT HEWSON (MISCELLANEOUS) ×3 IMPLANT
SET HNDPC FAN SPRY TIP SCT (DISPOSABLE) IMPLANT
SUT FIBERWIRE #2 38 REV NDL BL (SUTURE) ×9
SUT MNCRL AB 4-0 PS2 18 (SUTURE) ×3 IMPLANT
SUT VIC AB 0 CT1 27 (SUTURE) ×2
SUT VIC AB 0 CT1 27XBRD ANBCTR (SUTURE) ×1 IMPLANT
SUT VIC AB 1 CT1 27 (SUTURE) ×4
SUT VIC AB 1 CT1 27XBRD ANBCTR (SUTURE) ×2 IMPLANT
SUT VIC AB 3-0 SH 8-18 (SUTURE) ×3 IMPLANT
SUTURE FIBERWR#2 38 REV NDL BL (SUTURE) ×3 IMPLANT
SYR CONTROL 10ML LL (SYRINGE) ×3 IMPLANT
TOWEL OR 17X24 6PK STRL BLUE (TOWEL DISPOSABLE) ×3 IMPLANT
TOWEL OR 17X26 10 PK STRL BLUE (TOWEL DISPOSABLE) ×3 IMPLANT
TOWER CARTRIDGE SMART MIX (DISPOSABLE) IMPLANT
TRAY FOLEY CATH 16FRSI W/METER (SET/KITS/TRAYS/PACK) ×3 IMPLANT
WATER STERILE IRR 1000ML POUR (IV SOLUTION) ×12 IMPLANT

## 2015-11-26 NOTE — Progress Notes (Signed)
Patient from surgery confused and combative . Will not let Staff take VS or place on Monitors, Pulling off her IV and tubes crying out family at bedside

## 2015-11-26 NOTE — Discharge Instructions (Signed)

## 2015-11-26 NOTE — Progress Notes (Signed)
PT Cancellation Note  Patient Details Name: Riki RuskLois Okuda MRN: [id number] DOB: 27-Aug-1933   Cancelled Treatment:    Reason Eval/Treat Not Completed: Patient at procedure or test/unavailable.  Pt to OR today.  Will f/u tomorrow.   Mendi Constable, Alison MurrayMegan F 11/26/2015, 8:24 AM

## 2015-11-26 NOTE — Consult Note (Signed)
  ORTHOPAEDIC CONSULTATION  REQUESTING PHYSICIAN: Costin Otelia SergeantM Gherghe, MD  Chief Complaint: Left hip pain  HPI: Kristen Nguyen is a 82 y.o. female who complains of  severe left hip pain after a mechanical fall yesterday. She has advanced dementia. She was unable to walk. She is normally capable of walking. She apparently fell while taking the trash out, and was found outside. Her capacity to provide history is limited. Movement makes it worse, rest makes it better.    Past Medical History  Diagnosis Date  . Alzheimer's disease   . Essential hypertension   . HLD (hyperlipidemia)   . Depression    Past Surgical History  Procedure Laterality Date  . Abdominal hysterectomy     Social History   Social History  . Marital Status: Married    Spouse Name: N/A  . Number of Children: N/A  . Years of Education: N/A   Social History Main Topics  . Smoking status: Never Smoker   . Smokeless tobacco: Not on file  . Alcohol Use: No  . Drug Use: No  . Sexual Activity: Not on file   Other Topics Concern  . Not on file   Social History Narrative   No family history on file. No Known Allergies   Positive ROS: All other systems have been reviewed and were otherwise negative with the exception of those mentioned in the HPI and as above.  Physical Exam: General: Alert, no acute distress Cardiovascular: No pedal edema Respiratory: No cyanosis, no use of accessory musculature GI: No organomegaly, abdomen is soft and non-tender Skin: No lesions in the area of chief complaint Neurologic: Sensation intact distally Psychiatric: Patient is competent for consent with normal mood and affect Lymphatic: No axillary or cervical lymphadenopathy  MUSCULOSKELETAL: Left hip EHL and FHL seemed to be intact although she does not really cooperate much with the exam. Positive log roll and pain to palpation over the left hip.   Assessment: Principal Problem:   Closed left hip fracture (HCC) Active  Problems:   Alzheimer's disease   Fall   Essential hypertension   HLD (hyperlipidemia)   Depression   Abnormal EKG     Plan: The risks benefits and alternatives were discussed with the patient and the family including but not limited to the risks of nonoperative treatment, versus surgical intervention including infection, bleeding, nerve injury, periprosthetic fracture, the need for revision surgery, dislocation, leg length discrepancy, blood clots, cardiopulmonary complications, morbidity, mortality, among others, and they were willing to proceed.    We're going to plan for left hip hemiarthroplasty.Marland Kitchen.     Eulas PostLANDAU,Jamair Cato P, MD Cell [phone]   11/26/2015 7:05 AM

## 2015-11-26 NOTE — Progress Notes (Signed)
Patient remains confused trying to get out of bed .Patient husband remains at bedside yelling at patient telling her to stop or he's going home.Call placed to Dr.Niu to get medication for agitation.

## 2015-11-26 NOTE — Progress Notes (Addendum)
Patient arrived to room 5 n31 from emergency room.Patient is confused combative towards staff and pulling at invasive lines.Patient keeps saying over over"leave me alone  ;I don't need to be here.I'm okay .Where is Kristen Nguyen?" Patient husband Kristen Nguyen in waiting room brought to patient room.Kristen Nguyen stated,"She usually does not act like this.I'm so sorry.Mittens placed on patient to keep her from pulling out IV line.Patient chg bath ,ekg,and mrsa swab done.

## 2015-11-26 NOTE — Progress Notes (Signed)
Initial Nutrition Assessment  DOCUMENTATION CODES:   Not applicable  INTERVENTION:  Once diet advances, provide Ensure Enlive po BID, each supplement provides 350 kcal and 20 grams of protein.  NUTRITION DIAGNOSIS:   Increased nutrient needs related to  (s/p surgery) as evidenced by estimated needs.  GOAL:   Patient will meet greater than or equal to 90% of their needs  MONITOR:   PO intake, Supplement acceptance, Labs, Weight trends, I & O's, Diet advancement  REASON FOR ASSESSMENT:   Consult Assessment of nutrition requirement/status  ASSESSMENT:   82 y.o. female who complains of severe left hip pain after a mechanical fall yesterday. She has advanced dementia. She was unable to walk. She is normally capable of walking. She apparently fell while taking the trash out, and was found outside. Her capacity to provide history is limited. Movement makes it worse, rest makes it better.   PROCEDURE: (6/2): Left hip press-fit hemiarthroplasty, unipolar  Pt was confused and agitated during time of visit. RD unable to obtain most recent nutrition history. RD to order Ensure to aid in caloric and protein needs. Nursing staff to provide once diet advances. Pt with no observed significant fat or muscle mass loss.   Labs and medications reviewed.   Diet Order:  Diet clear liquid Room service appropriate?: Yes; Fluid consistency:: Thin  Skin:   (Incision on hip)  Last BM:  Unknown  Height:   Ht Readings from Last 1 Encounters:  11/25/15 5\' 9"  (1.753 m)    Weight:   Wt Readings from Last 1 Encounters:  11/26/15 132 lb 7.9 oz (60.1 kg)    Ideal Body Weight:  65.9 kg  BMI:  Body mass index is 19.56 kg/(m^2).  Estimated Nutritional Needs:   Kcal:  1600-1800  Protein:  70-80 grams  Fluid:  1.6 - 1.8 L/day  EDUCATION NEEDS:   No education needs identified at this time  Roslyn SmilingStephanie Delray Reza, MS, RD, LDN Pager # [phone] After hours/ weekend pager # [phone]

## 2015-11-26 NOTE — Progress Notes (Signed)
OT Cancellation Note  Patient Details Name: Kristen RuskLois Azimi MRN: [id number] DOB: 1933-11-24   Cancelled Treatment:    Reason Eval/Treat Not Completed: Patient at procedure or test/ unavailable. Pt currently in surgery.  Evette GeorgesLeonard, Jorden Minchey Eva [phone] 11/26/2015, 10:18 AM

## 2015-11-26 NOTE — Transfer of Care (Signed)
Immediate Anesthesia Transfer of Care Note  Patient: Kristen RuskLois Nguyen  Procedure(s) Performed: Procedure(s): ARTHROPLASTY BIPOLAR HIP (HEMIARTHROPLASTY) (Left)  Patient Location: PACU  Anesthesia Type:General  Level of Consciousness: awake, alert  and oriented  Airway & Oxygen Therapy: Patient Spontanous Breathing and Patient connected to nasal cannula oxygen  Post-op Assessment: Report given to RN and Post -op Vital signs reviewed and stable  Post vital signs: Reviewed and stable  Last Vitals:  Filed Vitals:   11/26/15 0132 11/26/15 0526  BP: 121/72 125/49  Pulse: 125   Temp: 37.4 C 37 C  Resp: 20 20    Last Pain:  Filed Vitals:   11/26/15 0533  PainSc: 9          Complications: No apparent anesthesia complications

## 2015-11-26 NOTE — Progress Notes (Signed)
PROGRESS NOTE  Kristen Nguyen ZOX:[id number] DOB: 10-29-1933 DOA: 11/25/2015 PCP: No primary care provider on file.    Brief Narrative: 82 y.o. female with medical history significant of dementia, hypertension, hyperlipidemia, depression, who presented with left hip pain after fall.   Assessment & Plan: Principal Problem:   Closed left hip fracture (HCC) Active Problems:   Alzheimer's disease   Essential hypertension   HLD (hyperlipidemia)   Depression   Abnormal EKG   Closed left hip fracture (HCC)  - As evidenced by x-ray - orthopedic surgery consulted, she is s/p left hip press-fit hemiarthroplasty on 6/2  Dementia with behavioral disturbances - continue Eexlon - prn ativan for agitation - start seroquel low dose tonight  Essential hypertension - continue amlodipine - IV hydralazine when necessary  HLD - continue home medications: Pravastatin  Depression - Continue Prozac  Abnormal EKG  - EKG showed left bundle blockade, no old EKG to compare with. Patient does not seem to have chest pain, less likely to have heat attack. - Troponin 3 negative - Start aspirin   DVT prophylaxis: SCD Code Status: Full Family Communication: husband, daughter in law, son bedside Disposition Plan: SNF 2-3 days  Consultants:   orthoepdic surgery   Procedures:  Left hip press-fit hemiarthroplasty, unipolar, Dr. Dion Saucier, 6/2  Antimicrobials:  Peri op   Subjective: - crying, agitated, confused. Complains of left hip pain  Objective: Filed Vitals:   11/26/15 0924 11/26/15 0936 11/26/15 0948 11/26/15 1000  BP:      Pulse: 80 78 70 72  Temp:      TempSrc:      Resp: Height:      Weight:      SpO2: 100% 100% 99% 100%    Intake/Output Summary (Last 24 hours) at 11/26/15 1133 Last data filed at 11/26/15 0913  Gross per 24 hour  Intake 1032.5 ml  Output    350 ml  Net  682.5 ml   Filed Weights   11/25/15 2125 11/26/15 0132  Weight: 68.04 kg (150 lb)  60.1 kg (132 lb 7.9 oz)   Examination: Constitutional: agitated  Filed Vitals:   11/26/15 0924 11/26/15 0936 11/26/15 0948 11/26/15 1000  BP:      Pulse: 80 78 70 72  Temp:      TempSrc:      Resp: Height:      Weight:      SpO2: 100% 100% 99% 100%   Eyes: PERRL, lids and conjunctivae normal Respiratory: clear to auscultation bilaterally, no wheezing, no crackles. Does not cooperate with exam Cardiovascular: Regular rate and rhythm, no murmurs / rubs / gallops. No LE edema.   Abdomen: no tenderness. Bowel sounds positive.  Musculoskeletal: no clubbing / cyanosis. No joint deformity upper and lower extremities. No contractures. Decreased muscle tone.  Skin: no rashes, lesions, ulcers. No induration Neurologic: does not follow commands, moves all 4 spontaneously Psychiatric: anxious  Data Reviewed: I have personally reviewed following labs and imaging studies  CBC:  Recent Labs Lab 11/25/15 2215 11/26/15 0221  WBC 14.4* 14.3*  HGB 12.5 11.8*  HCT 39.2 37.5  MCV 91.0 91.0  PLT 314 294   Basic Metabolic Panel:  Recent Labs Lab 11/25/15 2215 11/26/15 0221  NA 140 140  K 4.2 3.8  CL 106 108  CO2 24 26  GLUCOSE 89 129*  BUN 13 13  CREATININE 0.96 0.94  CALCIUM 9.1 8.8*   Coagulation Profile:  Recent Labs Lab 11/25/15 2215  INR 1.17   Cardiac Enzymes:  Recent Labs Lab 11/26/15 0359 11/26/15 0924  TROPONINI <0.03 <0.03   Lipid Profile:  Recent Labs  11/26/15 0211  CHOL 179  HDL 35*  LDLCALC 104*  TRIG 202*  CHOLHDL 5.1   Sepsis Labs: Invalid input(s): PROCALCITONIN, LACTICIDVEN  Recent Results (from the past 240 hour(s))  Surgical pcr screen     Status: None   Collection Time: 11/26/15  2:00 AM  Result Value Ref Range Status   MRSA, PCR NEGATIVE NEGATIVE Final   Staphylococcus aureus NEGATIVE NEGATIVE Final    Comment:        The Xpert SA Assay (FDA approved for NASAL specimens in patients over 89 years of age), is one  component of a comprehensive surveillance program.  Test performance has been validated by [facility] for patients greater than or equal to 11 year old. It is not intended to diagnose infection nor to guide or monitor treatment.     Radiology Studies: Dg Chest 1 View  11/25/2015  CLINICAL DATA:  Status post fall, with left hip deformity. Concern for chest injury. Initial encounter. EXAM: CHEST 1 VIEW COMPARISON:  None. FINDINGS: The lungs are mildly hyperexpanded. Minimal bilateral atelectasis or scarring is noted. There is no evidence of pleural effusion or pneumothorax. The cardiomediastinal silhouette is borderline normal. No acute osseous abnormalities are seen. IMPRESSION: Minimal bilateral atelectasis or scarring noted. Lungs otherwise clear. Electronically Signed   By: Roanna Raider M.D.   On: 11/25/2015 23:09   Ct Head Wo Contrast  11/26/2015  CLINICAL DATA:  Altered mental status.  Confusion and combative. EXAM: CT HEAD WITHOUT CONTRAST TECHNIQUE: Contiguous axial images were obtained from the base of the skull through the vertex without intravenous contrast. COMPARISON:  None. FINDINGS: Brain: Generalized cerebral atrophy, mild- moderate chronic small vessel ischemia.No intracranial hemorrhage, mass effect, or midline shift. No hydrocephalus. The basilar cisterns are patent. No evidence of territorial infarct. No intracranial fluid collection. Vascular: No hyperdense vessel or abnormal calcification. Atherosclerosis of skullbase vasculature. Skull:  Calvarium is intact. Sinuses/Orbits: Minimal mucosal thickening of the inferior right maxillary sinus. Paranasal sinuses are otherwise clear. Mastoid air cells are well aerated. Other: None. IMPRESSION: Atrophy and chronic small vessel ischemia, no acute intracranial abnormality. Electronically Signed   By: Rubye Oaks M.D.   On: 11/26/2015 03:45   Pelvis Portable  11/26/2015  CLINICAL DATA:  Bipolar left hip arthroplasty EXAM: PORTABLE  PELVIS 1-2 VIEWS COMPARISON:  None. FINDINGS: Single view shows bipolar hip arthroplasty on the left. Components appear well positioned. No radiographically detectable complication. IMPRESSION: Good appearance following left hip arthroplasty. Electronically Signed   By: Paulina Fusi M.D.   On: 11/26/2015 10:34   Dg Hip Unilat With Pelvis 2-3 Views Left  11/25/2015  CLINICAL DATA:  Status post unwitnessed fall, with left hip pain. Initial encounter. EXAM: DG HIP (WITH OR WITHOUT PELVIS) 2-3V LEFT COMPARISON:  None. FINDINGS: There is a mildly displaced subcapital fracture through the left femoral neck. The left femoral head remains seated at the acetabulum. The right hip joint is grossly unremarkable. No significant degenerative change is appreciated. The sacroiliac joints are unremarkable in appearance. The visualized bowel gas pattern is grossly unremarkable in appearance. IMPRESSION: Mildly displaced subcapital fracture through the left femoral neck. Electronically Signed   By: Roanna Raider M.D.   On: 11/25/2015 23:08   Scheduled Meds: . amLODipine  5 mg Oral Daily  .  ceFAZolin (ANCEF)  IV  2 g Intravenous Q6H  . cholecalciferol  1,000 Units Oral Daily  . docusate sodium  100 mg Oral BID  . [START ON 11/27/2015] enoxaparin (LOVENOX) injection  40 mg Subcutaneous Q24H  . ferrous sulfate  325 mg Oral TID PC  . FLUoxetine  20 mg Oral Daily  . pravastatin  20 mg Oral q1800  . QUEtiapine  12.5 mg Oral QHS  . rivastigmine  6 mg Oral Q breakfast  . senna  1 tablet Oral BID  . vitamin B-12  1,000 mcg Oral Daily   Continuous Infusions: . sodium chloride     Pamella Pertostin Louise Victory, MD, PhD [facility] Pager [phone]  If 7PM-7AM, please contact night-coverage [email or web address] Password [email or web address] 11/26/2015, 11:33 AM

## 2015-11-26 NOTE — Op Note (Signed)
11/25/2015 - 11/26/2015  8:42 AM  PATIENT:  Kristen Nguyen   MRN: [id number]  PRE-OPERATIVE DIAGNOSIS:  left displaced femoral neck fracture  POST-OPERATIVE DIAGNOSIS:  left displaced femoral neck fracture  PROCEDURE:  Procedure(s): Left hip press-fit hemiarthroplasty, unipolar  PREOPERATIVE INDICATIONS:  Kristen Nguyen is an 82 y.o. female who was admitted 11/25/2015 with a diagnosis of Closed left hip fracture ([place]) and elected for surgical management.  The risks benefits and alternatives were discussed with the patient and the family due to her advanced dementia including but not limited to the risks of nonoperative treatment, versus surgical intervention including infection, bleeding, nerve injury, periprosthetic fracture, the need for revision surgery, dislocation, leg length discrepancy, blood clots, cardiopulmonary complications, morbidity, mortality, among others, and they were willing to proceed.  Predicted outcome is good, although there will be at least a six to nine month expected recovery.   OPERATIVE REPORT     SURGEON:  Marchia Bond, MD    ASSISTANT:  Joya Gaskins, OPA-C  (Present throughout the entire procedure,  necessary for completion of procedure in a timely manner, assisting with retraction, instrumentation, and closure)     ANESTHESIA:  General    COMPLICATIONS:  None.     Unique aspects of the case: Her sciatic nerve was immediately visible within the field, as she was fairly thin, and this was protected in its integrity confirmed throughout the case and at the completion of the case.  COMPONENTS:  Chemical engineer Femoral Fracture stem size 5, with a -3 spacer and a size 47 fracture head unipolar hip ball.    PROCEDURE IN DETAIL: The patient was met in the holding area and identified.  The appropriate hip  was marked at the operative site. The patient was then transported to the OR and  placed under general anesthesia.  At that point, the patient was  placed  in the lateral decubitus position with the operative side up and  secured to the operating room table and all bony prominences padded.     The operative lower extremity was prepped from the iliac crest to the toes.  Sterile draping was performed.  Time out was performed prior to incision.      A routine posterolateral approach was utilized via sharp dissection  carried down to the subcutaneous tissue.  Gross bleeders were Bovie  coagulated.  The iliotibial band was identified and incised  along the length of the skin incision.  Self-retaining retractors were  inserted.  With the hip internally rotated, the short external rotators  were identified. The piriformis was tagged with FiberWire, and the hip capsule released in a T-type fashion.  The femoral neck was exposed, and I resected the femoral neck using the appropriate jig. This was performed at approximately a thumb's breadth above the lesser trochanter.    I then exposed the deep acetabulum, cleared out any tissue including the ligamentum teres, and included the hip capsule in the FiberWire used above and below the T.    I then prepared the proximal femur using the cookie-cutter, the lateralizing reamer, and then sequentially broached.  A trial utilized, and I reduced the hip and it was found to have excellent stability with functional range of motion. The trial components were then removed.   I then place the real implant and I impacted the real head ball into place. The hip was then reduced and taken through functional range of motion and found to have excellent stability. Leg lengths were  restored.  I then used a 2 mm drill bits to pass the FiberWire suture from the capsule and piriformis through the greater trochanter, and secured this. Excellent posterior capsular repair was achieved. I also closed the T in the capsule.  I then irrigated the hip copiously again with pulse lavage, and repaired the fascia with Vicryl, followed by Vicryl for  the subcutaneous tissue, Monocryl for the skin, Steri-Strips and sterile gauze. The wounds were injected. The patient was then awakened and returned to PACU in stable and satisfactory condition. There were no complications.  Marchia Bond, MD Orthopedic Surgeon [phone]   11/26/2015 8:42 AM

## 2015-11-26 NOTE — Anesthesia Procedure Notes (Signed)
Procedure Name: Intubation Date/Time: 11/26/2015 7:37 AM Performed by: Marena ChancyBECKNER, Ely Ballen S Pre-anesthesia Checklist: Emergency Drugs available, Patient identified, Suction available, Patient being monitored and Timeout performed Patient Re-evaluated:Patient Re-evaluated prior to inductionOxygen Delivery Method: Circle system utilized Preoxygenation: Pre-oxygenation with 100% oxygen Intubation Type: IV induction Ventilation: Mask ventilation without difficulty Laryngoscope Size: Mac and 3 Grade View: Grade II Tube type: Oral Tube size: 7.0 mm Number of attempts: 1 Placement Confirmation: ETT inserted through vocal cords under direct vision,  positive ETCO2 and breath sounds checked- equal and bilateral Tube secured with: Tape Dental Injury: Teeth and Oropharynx as per pre-operative assessment

## 2015-11-26 NOTE — Anesthesia Preprocedure Evaluation (Addendum)
Anesthesia Evaluation  Patient identified by MRN, date of birth, ID band Patient awake    Reviewed: Allergy & Precautions, NPO status , Patient's Chart, lab work & pertinent test results  Airway Mallampati: II  TM Distance: >3 FB Neck ROM: Full    Dental  (+) Dental Advisory Given, Edentulous Upper, Edentulous Lower   Pulmonary neg pulmonary ROS,    Pulmonary exam normal breath sounds clear to auscultation       Cardiovascular hypertension, Pt. on medications Normal cardiovascular exam Rhythm:Regular Rate:Normal     Neuro/Psych PSYCHIATRIC DISORDERS Depression Alzheimer's disease    GI/Hepatic negative GI ROS, Neg liver ROS,   Endo/Other  negative endocrine ROS  Renal/GU negative Renal ROS     Musculoskeletal negative musculoskeletal ROS (+)   Abdominal   Peds  Hematology  (+) Blood dyscrasia, anemia ,   Anesthesia Other Findings Day of surgery medications reviewed with the patient.  Reproductive/Obstetrics                            Anesthesia Physical Anesthesia Plan  ASA: II  Anesthesia Plan: General   Post-op Pain Management:    Induction: Intravenous  Airway Management Planned: Oral ETT  Additional Equipment:   Intra-op Plan:   Post-operative Plan: Extubation in OR and Possible Post-op intubation/ventilation  Informed Consent: I have reviewed the patients History and Physical, chart, labs and discussed the procedure including the risks, benefits and alternatives for the proposed anesthesia with the patient or authorized representative who has indicated his/her understanding and acceptance.   Dental advisory given  Plan Discussed with: CRNA  Anesthesia Plan Comments: (Risks/benefits of general anesthesia discussed with patient including risk of damage to teeth, lips, gum, and tongue, nausea/vomiting, allergic reactions to medications, and the possibility of heart attack,  stroke and death.  All patient questions answered.  Patient wishes to proceed.)       Anesthesia Quick Evaluation

## 2015-11-27 DIAGNOSIS — F329 Major depressive disorder, single episode, unspecified: Secondary | ICD-10-CM | POA: Diagnosis present

## 2015-11-27 DIAGNOSIS — F0281 Dementia in other diseases classified elsewhere with behavioral disturbance: Secondary | ICD-10-CM | POA: Diagnosis present

## 2015-11-27 DIAGNOSIS — S72002A Fracture of unspecified part of neck of left femur, initial encounter for closed fracture: Secondary | ICD-10-CM | POA: Diagnosis present

## 2015-11-27 DIAGNOSIS — Z79899 Other long term (current) drug therapy: Secondary | ICD-10-CM | POA: Diagnosis not present

## 2015-11-27 DIAGNOSIS — E785 Hyperlipidemia, unspecified: Secondary | ICD-10-CM | POA: Diagnosis present

## 2015-11-27 DIAGNOSIS — R9431 Abnormal electrocardiogram [ECG] [EKG]: Secondary | ICD-10-CM

## 2015-11-27 DIAGNOSIS — D62 Acute posthemorrhagic anemia: Secondary | ICD-10-CM | POA: Diagnosis not present

## 2015-11-27 DIAGNOSIS — I10 Essential (primary) hypertension: Secondary | ICD-10-CM | POA: Diagnosis present

## 2015-11-27 DIAGNOSIS — G308 Other Alzheimer's disease: Secondary | ICD-10-CM | POA: Diagnosis not present

## 2015-11-27 DIAGNOSIS — G309 Alzheimer's disease, unspecified: Secondary | ICD-10-CM | POA: Diagnosis present

## 2015-11-27 DIAGNOSIS — Z7982 Long term (current) use of aspirin: Secondary | ICD-10-CM | POA: Diagnosis not present

## 2015-11-27 DIAGNOSIS — W19XXXA Unspecified fall, initial encounter: Secondary | ICD-10-CM | POA: Diagnosis present

## 2015-11-27 LAB — CBC
HCT: 32.8 % — ABNORMAL LOW (ref 36.0–46.0)
Hemoglobin: 10.1 g/dL — ABNORMAL LOW (ref 12.0–15.0)
MCH: 28.6 pg (ref 26.0–34.0)
MCHC: 30.8 g/dL (ref 30.0–36.0)
MCV: 92.9 fL (ref 78.0–100.0)
PLATELETS: 232 10*3/uL (ref 150–400)
RBC: 3.53 MIL/uL — AB (ref 3.87–5.11)
RDW: 14 % (ref 11.5–15.5)
WBC: 11.7 10*3/uL — ABNORMAL HIGH (ref 4.0–10.5)

## 2015-11-27 LAB — BASIC METABOLIC PANEL
ANION GAP: 8 (ref 5–15)
BUN: 11 mg/dL (ref 6–20)
CALCIUM: 8.4 mg/dL — AB (ref 8.9–10.3)
CO2: 26 mmol/L (ref 22–32)
CREATININE: 0.79 mg/dL (ref 0.44–1.00)
Chloride: 104 mmol/L (ref 101–111)
GFR calc Af Amer: >60 mL/min (ref >60)
GLUCOSE: 123 mg/dL — AB (ref 65–99)
Potassium: 3.8 mmol/L (ref 3.5–5.1)
Sodium: 138 mmol/L (ref 135–145)

## 2015-11-27 NOTE — Progress Notes (Signed)
Patient ID: [name], female   DOB: [birth year], 82 y.o.   MRN: [id number]     Subjective:  Patient reports pain as mild to moderate.  Patient agitated but has calmed some in the past hour. She is able to follow commands but is not aware of time or place.  Denies any CP or SOB  Objective:   VITALS:   Filed Vitals:   11/26/15 0948 11/26/15 1000 11/26/15 2028 11/27/15 0406  BP:   137/53 138/49  Pulse: 70 72 77 74  Temp:   98.3 F (36.8 C) 98.5 F (36.9 C)  TempSrc:   Oral Oral  Resp: 19 23 20 18   Height:      Weight:      SpO2: 99% 100% 91% 100%    ABD soft Sensation intact distally Dorsiflexion/Plantar flexion intact Incision: dressing C/D/I and no drainage Good foot and ankle motion  Lab Results  Component Value Date   WBC 11.7* 11/27/2015   HGB 10.1* 11/27/2015   HCT 32.8* 11/27/2015   MCV 92.9 11/27/2015   PLT 232 11/27/2015   BMET    Component Value Date/Time   NA 138 11/27/2015 0643   K 3.8 11/27/2015 0643   CL 104 11/27/2015 0643   CO2 26 11/27/2015 0643   GLUCOSE 123* 11/27/2015 0643   BUN 11 11/27/2015 0643   CREATININE 0.79 11/27/2015 0643   CALCIUM 8.4* 11/27/2015 0643   GFRNONAA >60 11/27/2015 0643   GFRAA >60 11/27/2015 0643     Assessment/Plan: 1 Day Post-Op   Principal Problem:   Closed left hip fracture (HCC) Active Problems:   Alzheimer's disease   Essential hypertension   HLD (hyperlipidemia)   Depression   Abnormal EKG   Advance diet Up with therapy Nursing requesting consult for swallowing problems  Requesting speech consult Continue plan per medicine WBAT Dry dressing PRN    DOUGLAS PARRY, BRANDON 11/27/2015, 10:50 AM  Agree with above.   Teryl LucyJoshua Vishnu Moeller, MD Cell [phone]

## 2015-11-27 NOTE — Progress Notes (Signed)
PT Cancellation Note  Patient Details Name: Riki RuskLois Takacs MRN: [id number] DOB: [birth year]   Cancelled Treatment:    Reason Eval/Treat Not Completed: Fatigue/lethargy limiting ability to participate. Will check back later today.   Jovonte Commins LUBECK 11/27/2015, 11:46 AM

## 2015-11-27 NOTE — Evaluation (Signed)
Physical Therapy Evaluation Patient Details Name: Kristen RuskLois Nguyen MRN: [id number] DOB: 28-Oct-1933 Today's Date: 11/27/2015   History of Present Illness  82 y.o. female with medical history significant of dementia, hypertension, hyperlipidemia, depression, who presented with left hip pain after fall. She is status post repair 6/2. Hospitalization complicated by dementia with behavioral disturbances  Clinical Impression  Pt admitted with above diagnosis. Pt currently with functional limitations due to the deficits listed below (see PT Problem List). Pt will benefit from skilled PT to increase their independence and safety with mobility to allow discharge to the venue listed below.  Pt required +2 MAX A for mobility and limited by pain.  Will need to find best treatment time when pt has had pain meds, but is also alert as this AM she was too lethargic to participate.  Will need SNF and husband in agreement.     Follow Up Recommendations SNF;Supervision/Assistance - 24 hour    Equipment Recommendations  Other (comment) (TBD at next venue of care)    Recommendations for Other Services       Precautions / Restrictions Precautions Precautions: Posterior Hip;Fall Precaution Booklet Issued: Yes (comment) Precaution Comments: Handout given to spouse and discussed post. hip precautions.  Restrictions Weight Bearing Restrictions: Yes LLE Weight Bearing: Weight bearing as tolerated      Mobility  Bed Mobility Overal bed mobility: Needs Assistance;+2 for physical assistance Bed Mobility: Supine to Sit;Sit to Supine     Supine to sit: Max assist;+2 for physical assistance Sit to supine: Max assist;+2 for physical assistance   General bed mobility comments: Pt in high degree of pain, max encouragement given. Assist to advance BLE and to powerup/control descent of trunk all while maintaining posterior hip precautions. HOB elevated,   Transfers Overall transfer level: Needs assistance Equipment  used: Rolling walker (2 wheeled) Transfers: Sit to/from Stand Sit to Stand: Max assist;+2 physical assistance;From elevated surface         General transfer comment: bed height elevated. Max encouragement given. rw utilized with therapist blocking wheels. Pt using therapists arms to try to power up. Assist given at right ischium as well. Briefly stood before initiating sitting down. Pt indicating high pain level throughout.   Ambulation/Gait             General Gait Details: deferred  Stairs            Wheelchair Mobility    Modified Rankin (Stroke Patients Only)       Balance Overall balance assessment: History of Falls;Needs assistance Sitting-balance support: Bilateral upper extremity supported;Feet supported Sitting balance-Leahy Scale: Fair Sitting balance - Comments: able to sit with BUE support EOB for a few minutes. Min guard for safety.   Standing balance support: Bilateral upper extremity supported Standing balance-Leahy Scale: Zero                               Pertinent Vitals/Pain Pain Assessment: Faces Faces Pain Scale: Hurts worst Pain Location: L LE mostly with movement, but some with R LE movement as well.  complaining of IV site as well Pain Descriptors / Indicators: Crying;Grimacing;Moaning Pain Intervention(s): Limited activity within patient's tolerance;Monitored during session;Repositioned;Patient requesting pain meds-RN notified    Home Living Family/patient expects to be discharged to:: Skilled nursing facility                 Additional Comments: Discussed d/c plan with spouse.     Prior Function  Level of Independence: Independent      ADL's / Homemaking Assistance Needed: Pt appears to have not needed much if any physical assist PTA. Spouse reports pt dressed and fed herself, "her body was in good shape, just the dementia's the problem." Likely supervision to min guard for OOB/mobility/transfers PTA;, did not use  AD for mobility.         Hand Dominance        Extremity/Trunk Assessment   Upper Extremity Assessment: Defer to OT evaluation           Lower Extremity Assessment: LLE deficits/detail   LLE Deficits / Details: pain with any movement of L LE   Cervical / Trunk Assessment: Normal  Communication   Communication: No difficulties  Cognition Arousal/Alertness: Awake/alert Behavior During Therapy: Agitated (with movement) Overall Cognitive Status: History of cognitive impairments - at baseline       Memory: Decreased recall of precautions;Decreased short-term memory              General Comments General comments (skin integrity, edema, etc.): Pt agitated towards end of session.  soft mittens in place at end of session.  Pillow between legs and spoke with nursing to apply abd wedge once pt has had time to rest and gets more pain meds.    Exercises        Assessment/Plan    PT Assessment Patient needs continued PT services  PT Diagnosis Difficulty walking;Generalized weakness;Acute pain   PT Problem List Decreased strength;Decreased range of motion;Decreased activity tolerance;Decreased balance;Decreased mobility;Decreased coordination;Decreased cognition;Decreased safety awareness  PT Treatment Interventions DME instruction;Gait training;Functional mobility training;Therapeutic activities;Therapeutic exercise   PT Goals (Current goals can be found in the Care Plan section) Acute Rehab PT Goals Patient Stated Goal: to go to rehab PT Goal Formulation: With family Time For Goal Achievement: 12/04/15 Potential to Achieve Goals: Fair    Frequency Min 3X/week   Barriers to discharge        Co-evaluation   Reason for Co-Treatment: Necessary to address cognition/behavior during functional activity;For patient/therapist safety PT goals addressed during session: Mobility/safety with mobility OT goals addressed during session: ADL's and self-care       End of  Session Equipment Utilized During Treatment: Gait belt Activity Tolerance: Patient limited by pain Patient left: in bed;with call bell/phone within reach;with bed alarm set;with family/visitor present Nurse Communication: Mobility status;Patient requests pain meds         Time: 6045-4098 PT Time Calculation (min) (ACUTE ONLY): 32 min   Charges:   PT Evaluation $PT Eval Moderate Complexity: 1 Procedure     PT G Codes:        Kristen Nguyen LUBECK 11/27/2015, 4:38 PM

## 2015-11-27 NOTE — Progress Notes (Signed)
PROGRESS NOTE  Kristen Nguyen ZOX:[id number] DOB: August 01, 1933 DOA: 11/25/2015 PCP: No primary care provider on file.    Brief Narrative: 82 y.o. female with medical history significant of dementia, hypertension, hyperlipidemia, depression, who presented with left hip pain after fall. She is status post repair 6/2. Hospitalization complicated by dementia with behavioral disturbances  Assessment & Plan: Principal Problem:   Closed left hip fracture (HCC) Active Problems:   Alzheimer's disease   Essential hypertension   HLD (hyperlipidemia)   Depression   Abnormal EKG   Closed left hip fracture (HCC)  - As evidenced by x-ray - orthopedic surgery consulted, she is s/p left hip press-fit hemiarthroplasty on 6/2 - PT, monitor CBC post op, DVT per ortho  Dementia with behavioral disturbances - continue Eexlon - prn ativan for agitation - started seroquel low dose last night, had overall a quiet night   Essential hypertension - continue amlodipine - IV hydralazine when necessary - BP 138/49 this morning, continue current regimen   HLD - continue home medications: Pravastatin  Depression - Continue Prozac  Abnormal EKG  - EKG showed left bundle blockade, no old EKG to compare with. Patient does not seem to have chest pain, less likely to have heat attack. - Troponin 3 negative - Start aspirin - check 2D echo today   DVT prophylaxis: SCD Code Status: Full Family Communication: husband bedside Disposition Plan: SNF 2-3 days  Consultants:   orthoepdic surgery   Procedures:  Left hip press-fit hemiarthroplasty, unipolar, Dr. Dion Saucier, 6/2  Antimicrobials:  Peri op   Subjective: - calmer this morning, continues to be confused. No pain  Objective: Filed Vitals:   11/26/15 0948 11/26/15 1000 11/26/15 2028 11/27/15 0406  BP:   137/53 138/49  Pulse: 70 72 77 74  Temp:   98.3 F (36.8 C) 98.5 F (36.9 C)  TempSrc:   Oral Oral  Resp: 19 23 20 18   Height:        Weight:      SpO2: 99% 100% 91% 100%    Intake/Output Summary (Last 24 hours) at 11/27/15 1135 Last data filed at 11/27/15 0900  Gross per 24 hour  Intake      0 ml  Output    400 ml  Net   -400 ml   [facility] Weights   11/25/15 2125 11/26/15 0132  Weight: 68.04 kg (150 lb) 60.1 kg (132 lb 7.9 oz)   Examination: Constitutional: calm, NAD  Filed Vitals:   11/26/15 0948 11/26/15 1000 11/26/15 2028 11/27/15 0406  BP:   137/53 138/49  Pulse: 70 72 77 74  Temp:   98.3 F (36.8 C) 98.5 F (36.9 C)  TempSrc:   Oral Oral  Resp: 19 23 20 18   Height:      Weight:      SpO2: 99% 100% 91% 100%   Respiratory: clear to auscultation bilaterally, no wheezing, no crackles.  Cardiovascular: Regular rate and rhythm, no murmurs / rubs / gallops. No LE edema.   Abdomen: no tenderness. Bowel sounds positive.  Musculoskeletal: Decreased muscle tone.  Neurologic: non focal    Data Reviewed: I have personally reviewed following labs and imaging studies  CBC:  Recent Labs Lab 11/25/15 2215 11/26/15 0221 11/27/15 0643  WBC 14.4* 14.3* 11.7*  HGB 12.5 11.8* 10.1*  HCT 39.2 37.5 32.8*  MCV 91.0 91.0 92.9  PLT 314 294 232   Basic Metabolic Panel:  Recent Labs Lab 11/25/15 2215 11/26/15 0221 11/27/15 0643  NA 140 140 138  K 4.2 3.8 3.8  CL 106 108 104  CO2 24 26 26   GLUCOSE 89 129* 123*  BUN 13 13 11   CREATININE 0.96 0.94 0.79  CALCIUM 9.1 8.8* 8.4*   Coagulation Profile:  Recent Labs Lab 11/25/15 2215  INR 1.17   Cardiac Enzymes:  Recent Labs Lab 11/26/15 0359 11/26/15 0924 11/26/15 1557  TROPONINI <0.03 <0.03 <0.03   Lipid Profile:  Recent Labs  11/26/15 0211  CHOL 179  HDL 35*  LDLCALC 104*  TRIG 202*  CHOLHDL 5.1   Sepsis Labs: Invalid input(s): PROCALCITONIN, LACTICIDVEN  Recent Results (from the past 240 hour(s))  Surgical pcr screen     Status: None   Collection Time: 11/26/15  2:00 AM  Result Value Ref Range Status   MRSA, PCR NEGATIVE  NEGATIVE Final   Staphylococcus aureus NEGATIVE NEGATIVE Final    Comment:        The Xpert SA Assay (FDA approved for NASAL specimens in patients over 721 years of age), is one component of a comprehensive surveillance program.  Test performance has been validated by [facility] for patients greater than or equal to 80 year old. It is not intended to diagnose infection nor to guide or monitor treatment.     Radiology Studies: Dg Chest 1 View  11/25/2015  CLINICAL DATA:  Status post fall, with left hip deformity. Concern for chest injury. Initial encounter. EXAM: CHEST 1 VIEW COMPARISON:  None. FINDINGS: The lungs are mildly hyperexpanded. Minimal bilateral atelectasis or scarring is noted. There is no evidence of pleural effusion or pneumothorax. The cardiomediastinal silhouette is borderline normal. No acute osseous abnormalities are seen. IMPRESSION: Minimal bilateral atelectasis or scarring noted. Lungs otherwise clear. Electronically Signed   By: Roanna RaiderJeffery  Chang M.D.   On: 11/25/2015 23:09   Ct Head Wo Contrast  11/26/2015  CLINICAL DATA:  Altered mental status.  Confusion and combative. EXAM: CT HEAD WITHOUT CONTRAST TECHNIQUE: Contiguous axial images were obtained from the base of the skull through the vertex without intravenous contrast. COMPARISON:  None. FINDINGS: Brain: Generalized cerebral atrophy, mild- moderate chronic small vessel ischemia.No intracranial hemorrhage, mass effect, or midline shift. No hydrocephalus. The basilar cisterns are patent. No evidence of territorial infarct. No intracranial fluid collection. Vascular: No hyperdense vessel or abnormal calcification. Atherosclerosis of skullbase vasculature. Skull:  Calvarium is intact. Sinuses/Orbits: Minimal mucosal thickening of the inferior right maxillary sinus. Paranasal sinuses are otherwise clear. Mastoid air cells are well aerated. Other: None. IMPRESSION: Atrophy and chronic small vessel ischemia, no acute intracranial  abnormality. Electronically Signed   By: Rubye OaksMelanie  Ehinger M.D.   On: 11/26/2015 03:45   Pelvis Portable  11/26/2015  CLINICAL DATA:  Bipolar left hip arthroplasty EXAM: PORTABLE PELVIS 1-2 VIEWS COMPARISON:  None. FINDINGS: Single view shows bipolar hip arthroplasty on the left. Components appear well positioned. No radiographically detectable complication. IMPRESSION: Good appearance following left hip arthroplasty. Electronically Signed   By: Paulina FusiMark  Shogry M.D.   On: 11/26/2015 10:34   Dg Hip Unilat With Pelvis 2-3 Views Left  11/25/2015  CLINICAL DATA:  Status post unwitnessed fall, with left hip pain. Initial encounter. EXAM: DG HIP (WITH OR WITHOUT PELVIS) 2-3V LEFT COMPARISON:  None. FINDINGS: There is a mildly displaced subcapital fracture through the left femoral neck. The left femoral head remains seated at the acetabulum. The right hip joint is grossly unremarkable. No significant degenerative change is appreciated. The sacroiliac joints are unremarkable in appearance. The visualized bowel gas pattern is grossly unremarkable  in appearance. IMPRESSION: Mildly displaced subcapital fracture through the left femoral neck. Electronically Signed   By: Roanna Raider M.D.   On: 11/25/2015 23:08   Scheduled Meds: . amLODipine  5 mg Oral Daily  . cholecalciferol  1,000 Units Oral Daily  . docusate sodium  100 mg Oral BID  . enoxaparin (LOVENOX) injection  40 mg Subcutaneous Q24H  . feeding supplement (ENSURE ENLIVE)  237 mL Oral BID BM  . ferrous sulfate  325 mg Oral TID PC  . FLUoxetine  20 mg Oral Daily  . pravastatin  20 mg Oral q1800  . QUEtiapine  12.5 mg Oral QHS  . rivastigmine  6 mg Oral Q breakfast  . senna  1 tablet Oral BID  . vitamin B-12  1,000 mcg Oral Daily   Continuous Infusions: . sodium chloride 75 mL/hr (11/26/15 2104)   Pamella Pert, MD, PhD [facility] Pager [phone]  If 7PM-7AM, please contact night-coverage [email or web address] Password [email or web address] 11/27/2015, 11:35  AM

## 2015-11-27 NOTE — Clinical Social Work Note (Signed)
Clinical Social Work Assessment  Patient Details  Name: Kristen Nguyen MRN: [id number] Date of Birth: 1933-11-11  Date of referral:  11/27/15               Reason for consult:  Discharge Planning                Permission sought to share information with:  Case Manager, Facility Sport and exercise psychologist, Family Supports Permission granted to share information::  Yes, Verbal Permission Granted  Name::        Agency::   (SNF)  Relationship::     Contact Information:     Housing/Transportation Living arrangements for the past 2 months:  Single Family Home Source of Information:  Medical Team, Spouse Patient Interpreter Needed:  None Criminal Activity/Legal Involvement Pertinent to Current Situation/Hospitalization:  No - Comment as needed Significant Relationships:  Spouse Lives with:  Spouse Do you feel safe going back to the place where you live?    Need for family participation in patient care:  Yes (Comment)  Care giving concerns:  Pt spouse indicated that he prefers [facility] in [place] for SNF. Pt family also open to other SNF placements.   Social Worker assessment / plan:  CSW met with pt and spouse to discuss CSW role with discharge planning. Pt spouse indicated SNF preference. Pt spouse agreeable to SNF for higher level of care.   CSW will follow up with bed offers once available.   Employment status:  Retired Forensic scientist:    PT Recommendations:  [place] / Referral to community resources:  [place]  Patient/Family's Response to care:  Pt disoriented. Pt spouse understands SNF recommendation and appears to be happy with care his wife is receiving at [facility].   Patient/Family's Understanding of and Emotional Response to Diagnosis, Current Treatment, and Prognosis: Pt husband appears to have a good understanding of reason for Pt admission into hospital and pt care plan.   Emotional Assessment Appearance:  Appears stated  age, Well-Groomed Attitude/Demeanor/Rapport:    Affect (typically observed):    Orientation:    Alcohol / Substance use:   N/A Psych involvement (Current and /or in the community):     Discharge Needs  Concerns to be addressed:    Readmission within the last 30 days:    Current discharge risk:    Barriers to Discharge:      Junie Spencer, LCSW 11/27/2015, 4:16 PM

## 2015-11-27 NOTE — Progress Notes (Addendum)
OT Cancellation Note  Patient Details Name: Kristen Nguyen MRN: [id number] DOB: [birth year]   Cancelled Treatment:    Reason Eval/Treat Not Completed: Fatigue/lethargy limiting ability to participate. Spoke with nurse and spouse. Pt lethargic this morning due to meds, appears to be resting comfortably, eyes closed. Will reattempt later today hopefully when pt is more aroused.   [name], Christapher Gillian H 11/27/2015, 10:28 AM

## 2015-11-27 NOTE — Progress Notes (Signed)
Occupational Therapy Evaluation Patient Details Name: Kristen Nguyen MRN: [id number] DOB: [birth year] Today's Date: 11/27/2015    History of Present Illness 82 y.o. female with medical history significant of dementia, hypertension, hyperlipidemia, depression, who presented with left hip pain after fall. She is status post repair 6/2. Hospitalization complicated by dementia with behavioral disturbances   Clinical Impression   Pt admitted with the above diagnoses and presents with below problem list. Pt will benefit from continued acute OT to address the below listed deficits and maximize independence with BADLs prior to d/c to venue below. Per spouse, PTA pt was fairly independent with ADLs, likely setup to supervision for most ADLs, possibly min guard for shower transfer. Pt is currently needing +2 max-total A for LB ADLs. Bed mobility, sitting EOB for a few minutes, and briefly standing were achieved by the pt this session with +2 max physical A and max encouragement provided. Pt tearful and indicating 10/10 (Faces) pain with movement (Nursing notified). Pt with some agitated behavior (reaching for IV site "take it(IV) out") towards end of session, reapplied hand restraints. Pt appeared to be resting comfortably at end of session with eyes closed. Spouse present for start and end of session.      Follow Up Recommendations  SNF    Equipment Recommendations  Other (comment) (defer to next venue)    Recommendations for Other Services       Precautions / Restrictions Precautions Precautions: Posterior Hip;Fall Precaution Booklet Issued: Yes (comment) Precaution Comments: Handout given to spouse and discussed post. hip precautions.  Restrictions Weight Bearing Restrictions: Yes LLE Weight Bearing: Weight bearing as tolerated      Mobility Bed Mobility Overal bed mobility: Needs Assistance;+2 for physical assistance Bed Mobility: Supine to Sit;Sit to Supine     Supine to sit: Max  assist;+2 for physical assistance Sit to supine: Max assist;+2 for physical assistance   General bed mobility comments: Pt in high degree of pain, max encouragement given. Assist to advance BLE and to powerup/control descent of trunk all while maintaining posterior hip precautions. HOB elevated,   Transfers Overall transfer level: Needs assistance Equipment used: Rolling walker (2 wheeled) Transfers: Sit to/from Stand Sit to Stand: Max assist;+2 physical assistance;From elevated surface         General transfer comment: bed height elevated. Max encouragement given. rw utilized with therapist blocking wheels. Pt using therapists arms to try to power up. Assist given at right ischium as well. Briefly stood before initiating sitting down. Pt indicating high pain level throughout.     Balance Overall balance assessment: Needs assistance Sitting-balance support: Bilateral upper extremity supported;Feet supported Sitting balance-Leahy Scale: Fair Sitting balance - Comments: able to sit with BUE support EOB for a few minutes. Min guard for safety.                                    ADL Overall ADL's : Needs assistance/impaired Eating/Feeding: Set up;Bed level;Supervision/ safety   Grooming: Set up;Supervision/safety;Bed level   Upper Body Bathing: Moderate assistance;Sitting   Lower Body Bathing: Maximal assistance;Total assistance;+2 for physical assistance   Upper Body Dressing : Moderate assistance;Sitting   Lower Body Dressing: Maximal assistance;Total assistance;+2 for physical assistance;Sit to/from stand                 General ADL Comments: Pt lethargic but conversational upon OT/PT arrival. Pt limited by 10/10 (faces) with minimal movement. Completed bed  mobility with +2 assist and briefly stood EOB with +2 max A. Spouse present at start and end of session. Spouse would like for pt to go to SNF for rehab at d/c.       Vision     Perception      Praxis      Pertinent Vitals/Pain Pain Assessment: Faces Faces Pain Scale: Hurts worst Pain Location: mainly LLE, though gave pain response to touch/movement of RLE as well. C/o discomfort at IV site. Pain with movement. At end of session pt appeared to be resting comfortably with eyes closed.  Pain Descriptors / Indicators: Crying;Grimacing;Moaning Pain Intervention(s): Limited activity within patient's tolerance;Monitored during session;Repositioned;Other (comment);Utilized Brewing technologist (Notified nurse )     Hand Dominance     Extremity/Trunk Assessment Upper Extremity Assessment Upper Extremity Assessment: Overall WFL for tasks assessed;Generalized weakness   Lower Extremity Assessment Lower Extremity Assessment: Defer to PT evaluation       Communication Communication Communication: No difficulties;Other (comment) (speech was clear. baseline dementia.)   Cognition Arousal/Alertness: Awake/alert;Lethargic;Suspect due to medications Behavior During Therapy: Agitated;Anxious;Flat affect Overall Cognitive Status: History of cognitive impairments - at baseline       Memory: Decreased recall of precautions;Decreased short-term memory             General Comments       Exercises       Shoulder Instructions      Home Living Family/patient expects to be discharged to:: Skilled nursing facility                                 Additional Comments: Discussed d/c plan with spouse.       Prior Functioning/Environment Level of Independence: Needs assistance    ADL's / Homemaking Assistance Needed: Pt appears to have not needed much if any physical assist PTA. Spouse reports pt dressed and fed herself, "her body was in good shape, just the dementia's the problem." Likely supervision to min guard for OOB/mobility/transfers PTA;, did not use AD for mobility.         OT Diagnosis: Cognitive deficits;Acute pain   OT Problem List: Decreased  activity tolerance;Impaired balance (sitting and/or standing);Decreased cognition;Decreased safety awareness;Decreased knowledge of use of DME or AE;Decreased knowledge of precautions;Pain   OT Treatment/Interventions: Self-care/ADL training;DME and/or AE instruction;Therapeutic activities;Patient/family education;Balance training    OT Goals(Current goals can be found in the care plan section) Acute Rehab OT Goals Patient Stated Goal: unable to state OT Goal Formulation: With family Time For Goal Achievement: 12/11/15 Potential to Achieve Goals: Good ADL Goals Pt Will Perform Lower Body Bathing: with mod assist;sit to/from stand Pt Will Perform Lower Body Dressing: with mod assist;sit to/from stand Pt Will Transfer to Toilet: with mod assist;ambulating;bedside commode Pt Will Perform Toileting - Clothing Manipulation and hygiene: with mod assist;sitting/lateral leans;sit to/from stand Additional ADL Goal #1: Pt will complete bed mobility with max A to prepare to OOB ADLs.   OT Frequency: Min 2X/week   Barriers to D/C:            Co-evaluation PT/OT/SLP Co-Evaluation/Treatment: Yes Reason for Co-Treatment: Necessary to address cognition/behavior during functional activity;For patient/therapist safety   OT goals addressed during session: ADL's and self-care      End of Session Equipment Utilized During Treatment: Gait belt;Rolling walker Nurse Communication: Mobility status;Other (comment) (Pain level, reapply hip wedge after pain meds given)  Activity Tolerance: Patient limited by pain Patient  left: in bed;with call bell/phone within reach;with bed alarm set;with family/visitor present;with restraints reapplied   Time: 1358-1433 OT Time Calculation (min): 35 min Charges:  OT General Charges $OT Visit: 1 Procedure OT Evaluation $OT Eval Moderate Complexity: 1 Procedure G-Codes:    Pilar Grammes 12-08-15, 4:07 PM

## 2015-11-28 DIAGNOSIS — D62 Acute posthemorrhagic anemia: Secondary | ICD-10-CM

## 2015-11-28 LAB — CBC
HEMATOCRIT: 29.2 % — AB (ref 36.0–46.0)
HEMOGLOBIN: 9.1 g/dL — AB (ref 12.0–15.0)
MCH: 28.3 pg (ref 26.0–34.0)
MCHC: 31.2 g/dL (ref 30.0–36.0)
MCV: 90.7 fL (ref 78.0–100.0)
Platelets: 198 10*3/uL (ref 150–400)
RBC: 3.22 MIL/uL — AB (ref 3.87–5.11)
RDW: 13.5 % (ref 11.5–15.5)
WBC: 10.8 10*3/uL — AB (ref 4.0–10.5)

## 2015-11-28 LAB — BASIC METABOLIC PANEL
ANION GAP: 7 (ref 5–15)
BUN: 11 mg/dL (ref 6–20)
CALCIUM: 8.1 mg/dL — AB (ref 8.9–10.3)
CO2: 25 mmol/L (ref 22–32)
CREATININE: 0.82 mg/dL (ref 0.44–1.00)
Chloride: 106 mmol/L (ref 101–111)
GLUCOSE: 107 mg/dL — AB (ref 65–99)
POTASSIUM: 3.5 mmol/L (ref 3.5–5.1)
SODIUM: 138 mmol/L (ref 135–145)

## 2015-11-28 NOTE — Progress Notes (Signed)
Patient ID: Kristen RuskLois Nguyen, female   DOB: September 20, 1933, 82 y.o.   MRN: [id number]     Subjective:  Patient reports pain as mild to moderate.  Patient alert and follows commands but unaware of time and place.  Denies any CP or SOB  Objective:   VITALS:   Filed Vitals:   11/27/15 0406 11/27/15 1500 11/27/15 2016 11/28/15 0516  BP: 138/49 117/62 163/54 143/54  Pulse: 74 81 83 86  Temp: 98.5 F (36.9 C) 98.7 F (37.1 C) 99.8 F (37.7 C) 98.2 F (36.8 C)  TempSrc: Oral Oral Oral Oral  Resp: 18 18 16 16   Height:      Weight:      SpO2: 100% 93% 95% 100%    ABD soft Sensation intact distally Dorsiflexion/Plantar flexion intact Incision: dressing C/D/I and no drainage   Lab Results  Component Value Date   WBC 10.8* 11/28/2015   HGB 9.1* 11/28/2015   HCT 29.2* 11/28/2015   MCV 90.7 11/28/2015   PLT 198 11/28/2015   BMET    Component Value Date/Time   NA 138 11/28/2015 0546   K 3.5 11/28/2015 0546   CL 106 11/28/2015 0546   CO2 25 11/28/2015 0546   GLUCOSE 107* 11/28/2015 0546   BUN 11 11/28/2015 0546   CREATININE 0.82 11/28/2015 0546   CALCIUM 8.1* 11/28/2015 0546   GFRNONAA >60 11/28/2015 0546   GFRAA >60 11/28/2015 0546     Assessment/Plan: 2 Days Post-Op   Principal Problem:   Closed left hip fracture (HCC) Active Problems:   Alzheimer's disease   Essential hypertension   HLD (hyperlipidemia)   Depression   Abnormal EKG   Advance diet Up with therapy Continue plan per Medicine WBAT Dry dressing PRN   DOUGLAS PARRY, BRANDON 11/28/2015, 9:12 AM  Agree with above.   Teryl LucyJoshua Imaya Duffy, MD Cell [phone]

## 2015-11-28 NOTE — NC FL2 (Signed)
[place] MEDICAID FL2 LEVEL OF CARE SCREENING TOOL     IDENTIFICATION  Patient Name: Kristen Nguyen Birthdate: Feb 23, 1934 Sex: female Admission Date (Current Location): 11/25/2015  [facility] and [place] Number:  Producer, television/film/video and Address:  [facility], [address] [place], [place] [address]      Provider Number: [id number]  Attending Physician Name and Address:  Leatha Gilding, MD  Relative Name and Phone Number:       Current Level of Care: Hospital Recommended Level of Care: Skilled Nursing Facility Prior Approval Number:    Date Approved/Denied:   PASRR Number: [id number]  Discharge Plan: SNF    Current Diagnoses: Patient Active Problem List   Diagnosis Date Noted  . Closed left hip fracture (HCC) 11/26/2015  . Abnormal EKG 11/26/2015  . Essential hypertension   . HLD (hyperlipidemia)   . Depression   . Alzheimer's disease     Orientation RESPIRATION BLADDER Height & Weight     Self  Normal Incontinent Weight: 132 lb 7.9 oz (60.1 kg) Height:   (175.3 cm)  BEHAVIORAL SYMPTOMS/MOOD NEUROLOGICAL BOWEL NUTRITION STATUS   (none)  (none) Continent Diet (Dysphagia 2)  AMBULATORY STATUS COMMUNICATION OF NEEDS Skin   Extensive Assist Verbally Surgical wounds                       Personal Care Assistance Level of Assistance  Bathing, Feeding, Dressing Bathing Assistance: Limited assistance Feeding assistance: Independent Dressing Assistance: Limited assistance     Functional Limitations Info  Sight, Hearing, Speech Sight Info: Adequate Hearing Info: Adequate Speech Info: Adequate    SPECIAL CARE FACTORS FREQUENCY  PT (By licensed PT), OT (By licensed OT), Speech therapy     PT Frequency:  (5x/week) OT Frequency:  (5x/week)     Speech Therapy Frequency:  (5x/week)      Contractures Contractures Info: Not present    Additional Factors Info  Code Status, Allergies Code Status Info:  (full) Allergies  Info:  (NKDA)           Current Medications (11/28/2015):  This is the current hospital active medication list Current Facility-Administered Medications  Medication Dose Route Frequency Provider Last Rate Last Dose  . 0.9 %  sodium chloride infusion  75 mL/hr Intravenous Continuous Teryl Lucy, MD 75 mL/hr at 11/28/15 0559 75 mL/hr at 11/28/15 0559  . acetaminophen (TYLENOL) tablet 650 mg  650 mg Oral Q6H PRN Teryl Lucy, MD       Or  . acetaminophen (TYLENOL) suppository 650 mg  650 mg Rectal Q6H PRN Teryl Lucy, MD      . alum & mag hydroxide-simeth (MAALOX/MYLANTA) 200-200-20 MG/5ML suspension 30 mL  30 mL Oral Q4H PRN Teryl Lucy, MD      . amLODipine (NORVASC) tablet 5 mg  5 mg Oral Daily Lorretta Harp, MD   5 mg at 11/28/15 1036  . bisacodyl (DULCOLAX) suppository 10 mg  10 mg Rectal Daily PRN Teryl Lucy, MD      . cholecalciferol (VITAMIN D) tablet 1,000 Units  1,000 Units Oral Daily Lorretta Harp, MD      . docusate sodium (COLACE) capsule 100 mg  100 mg Oral BID Teryl Lucy, MD   100 mg at 11/26/15 1530  . enoxaparin (LOVENOX) injection 40 mg  40 mg Subcutaneous Q24H Teryl Lucy, MD   40 mg at 11/28/15 0929  . feeding supplement (ENSURE ENLIVE) (ENSURE ENLIVE) liquid 237 mL  237 mL Oral BID BM Leatha Gildingostin M Gherghe, MD   237 mL at 11/28/15 1042  . ferrous sulfate tablet 325 mg  325 mg Oral TID PC Teryl LucyJoshua Landau, MD   325 mg at 11/28/15 1036  . FLUoxetine (PROZAC) capsule 20 mg  20 mg Oral Daily Lorretta HarpXilin Niu, MD      . hydrALAZINE (APRESOLINE) injection 5 mg  5 mg Intravenous Q2H PRN Lorretta HarpXilin Niu, MD      . HYDROcodone-acetaminophen (NORCO/VICODIN) 5-325 MG per tablet 1-2 tablet  1-2 tablet Oral Q6H PRN Teryl LucyJoshua Landau, MD      . hydrOXYzine (VISTARIL) injection 25 mg  25 mg Intramuscular Q6H PRN Lorretta HarpXilin Niu, MD      . LORazepam (ATIVAN) injection 0.5 mg  0.5 mg Intravenous Q4H PRN Leatha Gildingostin M Gherghe, MD   0.5 mg at 11/26/15 1115  . magnesium citrate solution 1 Bottle  1 Bottle Oral Once PRN  Teryl LucyJoshua Landau, MD      . menthol-cetylpyridinium (CEPACOL) lozenge 3 mg  1 lozenge Oral PRN Teryl LucyJoshua Landau, MD       Or  . phenol (CHLORASEPTIC) mouth spray 1 spray  1 spray Mouth/Throat PRN Teryl LucyJoshua Landau, MD      . morphine 2 MG/ML injection 2 mg  2 mg Intravenous Q2H PRN Teryl LucyJoshua Landau, MD   2 mg at 11/28/15 1036  . ondansetron (ZOFRAN) tablet 4 mg  4 mg Oral Q6H PRN Teryl LucyJoshua Landau, MD       Or  . ondansetron [facility](ZOFRAN) injection 4 mg  4 mg Intravenous Q6H PRN Teryl LucyJoshua Landau, MD      . polyethylene glycol (MIRALAX / GLYCOLAX) packet 17 g  17 g Oral Daily PRN Teryl LucyJoshua Landau, MD      . pravastatin (PRAVACHOL) tablet 20 mg  20 mg Oral q1800 Lorretta HarpXilin Niu, MD      . QUEtiapine (SEROQUEL) tablet 12.5 mg  12.5 mg Oral QHS Leatha Gildingostin M Gherghe, MD   12.5 mg at 11/26/15 2110  . rivastigmine (EXELON) capsule 6 mg  6 mg Oral Q breakfast Lorretta HarpXilin Niu, MD      . senna [facility](SENOKOT) tablet 8.6 mg  1 tablet Oral BID Teryl LucyJoshua Landau, MD   8.6 mg at 11/26/15 1531  . vitamin B-12 (CYANOCOBALAMIN) tablet 1,000 mcg  1,000 mcg Oral Daily Lorretta HarpXilin Niu, MD   1,000 mcg at 11/28/15 1036     Discharge Medications: Please see discharge summary for a list of discharge medications.  Relevant Imaging Results:  Relevant Lab Results:   Additional Information SS#: [id number]  Terald Sleeperakiyah T Torrance Stockley, LCSW

## 2015-11-28 NOTE — Anesthesia Postprocedure Evaluation (Signed)
Anesthesia Post Note  Patient: Kristen Nguyen  Procedure(s) Performed: Procedure(s) (LRB): ARTHROPLASTY BIPOLAR HIP (HEMIARTHROPLASTY) (Left)  Patient location during evaluation: PACU Anesthesia Type: General Level of consciousness: awake and alert Pain management: pain level controlled Vital Signs Assessment: post-procedure vital signs reviewed and stable Respiratory status: spontaneous breathing, nonlabored ventilation, respiratory function stable and patient connected to nasal cannula oxygen Cardiovascular status: blood pressure returned to baseline and stable Postop Assessment: no signs of nausea or vomiting Anesthetic complications: no    Last Vitals:  Filed Vitals:   11/27/15 2016 11/28/15 0516  BP: 163/54 143/54  Pulse: 83 86  Temp: 37.7 C 36.8 C  Resp: 16 16    Last Pain:  Filed Vitals:   11/28/15 0516  PainSc: Asleep                 Cecile HearingStephen Edward Turk

## 2015-11-28 NOTE — Progress Notes (Signed)
PROGRESS NOTE  Kristen Nguyen ZOX:[id number] DOB: 09/11/1933 DOA: 11/25/2015 PCP: No primary care provider on file.   LOS: 1 day  Brief Narrative: 82 y.o. female with medical history significant of dementia, hypertension, hyperlipidemia, depression, who presented with left hip pain after fall. She is status post repair 6/2. Hospitalization complicated by dementia with behavioral disturbances  Assessment & Plan: Principal Problem:   Closed left hip fracture (HCC) Active Problems:   Alzheimer's disease   Essential hypertension   HLD (hyperlipidemia)   Depression   Abnormal EKG   Closed left hip fracture (HCC)  - As evidenced by x-ray - orthopedic surgery consulted, she is s/p left hip press-fit hemiarthroplasty on 6/2 - PT, monitor CBC post op, DVT prophylaxis per ortho, on Lovenox  Dementia with behavioral disturbances - continue Eexlon - prn ativan for agitation - started seroquel low dose daily at bedtime, seems to be helping the patient is less agitated overnight  Acute blood loss anemia - Post op, hemoglobin trending down, 9.1 this morning from 11.82 days ago, continue to monitor, noted for transfusion  Essential hypertension - continue amlodipine - IV hydralazine when necessary - BP 143/54 this morning, continue current regimen   HLD - continue home medications: Pravastatin  Depression - Continue Prozac  Abnormal EKG  - EKG showed left bundle blockade, no old EKG to compare with. Patient does not seem to have chest pain, less likely to have heat attack. - Troponin 3 negative - Start aspirin - 2-D echocardiogram pending   DVT prophylaxis: SCD Code Status: Full Family Communication: husband and daughter-in-law bedside Disposition Plan: SNF 1-2 days  Consultants:   orthoepdic surgery   Procedures:  Left hip press-fit hemiarthroplasty, unipolar, Dr. Dion Saucier, 6/2  Antimicrobials:  Peri op   Subjective: - calmer this morning, continues to be confused. No  pain  Objective: Filed Vitals:   11/27/15 0406 11/27/15 1500 11/27/15 2016 11/28/15 0516  BP: 138/49 117/62 163/54 143/54  Pulse: 74 81 83 86  Temp: 98.5 F (36.9 C) 98.7 F (37.1 C) 99.8 F (37.7 C) 98.2 F (36.8 C)  TempSrc: Oral Oral Oral Oral  Resp: Height:      Weight:      SpO2: 100% 93% 95% 100%    Intake/Output Summary (Last 24 hours) at 11/28/15 1040 Last data filed at 11/28/15 0600  Gross per 24 hour  Intake    825 ml  Output      0 ml  Net    825 ml   [facility] Weights   11/25/15 2125 11/26/15 0132  Weight: 68.04 kg (150 lb) 60.1 kg (132 lb 7.9 oz)   Examination: Constitutional: calm, NAD  Filed Vitals:   11/27/15 0406 11/27/15 1500 11/27/15 2016 11/28/15 0516  BP: 138/49 117/62 163/54 143/54  Pulse: 74 81 83 86  Temp: 98.5 F (36.9 C) 98.7 F (37.1 C) 99.8 F (37.7 C) 98.2 F (36.8 C)  TempSrc: Oral Oral Oral Oral  Resp: Height:      Weight:      SpO2: 100% 93% 95% 100%   Respiratory: clear to auscultation bilaterally, no wheezing, no crackles.  Cardiovascular: Regular rate and rhythm, no murmurs / rubs / gallops. No LE edema.   Abdomen: no tenderness. Bowel sounds positive.  Musculoskeletal: Decreased muscle tone.  Neurologic: non focal    Data Reviewed: I have personally reviewed following labs and imaging studies  CBC:  Recent Labs Lab 11/25/15 2215 11/26/15  0221 11/27/15 0643 11/28/15 0546  WBC 14.4* 14.3* 11.7* 10.8*  HGB 12.5 11.8* 10.1* 9.1*  HCT 39.2 37.5 32.8* 29.2*  MCV 91.0 91.0 92.9 90.7  PLT 314 294 232 198   Basic Metabolic Panel:  Recent Labs Lab 11/25/15 2215 11/26/15 0221 11/27/15 0643 11/28/15 0546  NA 140 140 138 138  K 4.2 3.8 3.8 3.5  CL 106 108 104 106  CO2 24 26 26 25   GLUCOSE 89 129* 123* 107*  BUN 13 13 11 11   CREATININE 0.96 0.94 0.79 0.82  CALCIUM 9.1 8.8* 8.4* 8.1*   Coagulation Profile:  Recent Labs Lab 11/25/15 2215  INR 1.17   Cardiac Enzymes:  Recent  Labs Lab 11/26/15 0359 11/26/15 0924 11/26/15 1557  TROPONINI <0.03 <0.03 <0.03   Lipid Profile:  Recent Labs  11/26/15 0211  CHOL 179  HDL 35*  LDLCALC 104*  TRIG 202*  CHOLHDL 5.1   Sepsis Labs: Invalid input(s): PROCALCITONIN, LACTICIDVEN  Recent Results (from the past 240 hour(s))  Surgical pcr screen     Status: None   Collection Time: 11/26/15  2:00 AM  Result Value Ref Range Status   MRSA, PCR NEGATIVE NEGATIVE Final   Staphylococcus aureus NEGATIVE NEGATIVE Final    Comment:        The Xpert SA Assay (FDA approved for NASAL specimens in patients over 321 years of age), is one component of a comprehensive surveillance program.  Test performance has been validated by [facility] for patients greater than or equal to 80 year old. It is not intended to diagnose infection nor to guide or monitor treatment.     Radiology Studies: No results found. Scheduled Meds: . amLODipine  5 mg Oral Daily  . cholecalciferol  1,000 Units Oral Daily  . docusate sodium  100 mg Oral BID  . enoxaparin (LOVENOX) injection  40 mg Subcutaneous Q24H  . feeding supplement (ENSURE ENLIVE)  237 mL Oral BID BM  . ferrous sulfate  325 mg Oral TID PC  . FLUoxetine  20 mg Oral Daily  . pravastatin  20 mg Oral q1800  . QUEtiapine  12.5 mg Oral QHS  . rivastigmine  6 mg Oral Q breakfast  . senna  1 tablet Oral BID  . vitamin B-12  1,000 mcg Oral Daily   Continuous Infusions: . sodium chloride 75 mL/hr (11/28/15 0559)   Pamella Pertostin Gherghe, MD, PhD [facility] Pager [phone]  If 7PM-7AM, please contact night-coverage [email or web address] Password [email or web address] 11/28/2015, 10:40 AM

## 2015-11-28 NOTE — [place] FL2 (Signed)
[place] MEDICAID FL2 LEVEL OF CARE SCREENING TOOL     IDENTIFICATION  Patient Name: Kristen Nguyen Birthdate: 07/08/1933 Sex: female Admission Date (Current Location): 11/25/2015  County and Medicaid Number:  [place]   Facility and Address:  [facility], [address], [place], [place] [address]      Provider Number: [id number]  Attending Physician Name and Address:  Costin M Gherghe, MD  Relative Name and Phone Number:       Current Level of Care: Hospital Recommended Level of Care: Skilled Nursing Facility Prior Approval Number:    Date Approved/Denied:   PASRR Number: [id number]  Discharge Plan: SNF    Current Diagnoses: Patient Active Problem List   Diagnosis Date Noted  . Closed left hip fracture (HCC) 11/26/2015  . Abnormal EKG 11/26/2015  . Essential hypertension   . HLD (hyperlipidemia)   . Depression   . Alzheimer's disease     Orientation RESPIRATION BLADDER Height & Weight     Self  Normal Incontinent Weight: 132 lb 7.9 oz (60.1 kg) Height:  5' 9" (175.3 cm)  BEHAVIORAL SYMPTOMS/MOOD NEUROLOGICAL BOWEL NUTRITION STATUS   (none)  (none) Continent Diet (Dysphagia 2)  AMBULATORY STATUS COMMUNICATION OF NEEDS Skin   Extensive Assist Verbally Surgical wounds                       Personal Care Assistance Level of Assistance  Bathing, Feeding, Dressing Bathing Assistance: Limited assistance Feeding assistance: Independent Dressing Assistance: Limited assistance     Functional Limitations Info  Sight, Hearing, Speech Sight Info: Adequate Hearing Info: Adequate Speech Info: Adequate    SPECIAL CARE FACTORS FREQUENCY  PT (By licensed PT), OT (By licensed OT), Speech therapy     PT Frequency:  (5x/week) OT Frequency:  (5x/week)     Speech Therapy Frequency:  (5x/week)      Contractures Contractures Info: Not present    Additional Factors Info  Code Status, Allergies Code Status Info:  (full) Allergies  Info:  (NKDA)           Current Medications (11/28/2015):  This is the current hospital active medication list Current Facility-Administered Medications  Medication Dose Route Frequency Provider Last Rate Last Dose  . 0.9 %  sodium chloride infusion  75 mL/hr Intravenous Continuous Joshua Landau, MD 75 mL/hr at 11/28/15 0559 75 mL/hr at 11/28/15 0559  . acetaminophen (TYLENOL) tablet 650 mg  650 mg Oral Q6H PRN Joshua Landau, MD       Or  . acetaminophen (TYLENOL) suppository 650 mg  650 mg Rectal Q6H PRN Joshua Landau, MD      . alum & mag hydroxide-simeth (MAALOX/MYLANTA) 200-200-20 MG/5ML suspension 30 mL  30 mL Oral Q4H PRN Joshua Landau, MD      . amLODipine (NORVASC) tablet 5 mg  5 mg Oral Daily Xilin Niu, MD   5 mg at 11/28/15 1036  . bisacodyl (DULCOLAX) suppository 10 mg  10 mg Rectal Daily PRN Joshua Landau, MD      . cholecalciferol (VITAMIN D) tablet 1,000 Units  1,000 Units Oral Daily Xilin Niu, MD      . docusate sodium (COLACE) capsule 100 mg  100 mg Oral BID Joshua Landau, MD   100 mg at 11/26/15 1530  . enoxaparin (LOVENOX) injection 40 mg  40 mg Subcutaneous Q24H Joshua Landau, MD   40 mg at 11/28/15 0929  . feeding supplement (ENSURE ENLIVE) (ENSURE ENLIVE) liquid 237 mL    237 mL Oral BID BM Leatha Gildingostin M Gherghe, MD   237 mL at 11/28/15 1042  . ferrous sulfate tablet 325 mg  325 mg Oral TID PC Teryl LucyJoshua Landau, MD   325 mg at 11/28/15 1036  . FLUoxetine (PROZAC) capsule 20 mg  20 mg Oral Daily Lorretta HarpXilin Niu, MD      . hydrALAZINE (APRESOLINE) injection 5 mg  5 mg Intravenous Q2H PRN Lorretta HarpXilin Niu, MD      . HYDROcodone-acetaminophen (NORCO/VICODIN) 5-325 MG per tablet 1-2 tablet  1-2 tablet Oral Q6H PRN Teryl LucyJoshua Landau, MD      . hydrOXYzine (VISTARIL) injection 25 mg  25 mg Intramuscular Q6H PRN Lorretta HarpXilin Niu, MD      . LORazepam (ATIVAN) injection 0.5 mg  0.5 mg Intravenous Q4H PRN Leatha Gildingostin M Gherghe, MD   0.5 mg at 11/26/15 1115  . magnesium citrate solution 1 Bottle  1 Bottle Oral Once PRN  Teryl LucyJoshua Landau, MD      . menthol-cetylpyridinium (CEPACOL) lozenge 3 mg  1 lozenge Oral PRN Teryl LucyJoshua Landau, MD       Or  . phenol (CHLORASEPTIC) mouth spray 1 spray  1 spray Mouth/Throat PRN Teryl LucyJoshua Landau, MD      . morphine 2 MG/ML injection 2 mg  2 mg Intravenous Q2H PRN Teryl LucyJoshua Landau, MD   2 mg at 11/28/15 1036  . ondansetron (ZOFRAN) tablet 4 mg  4 mg Oral Q6H PRN Teryl LucyJoshua Landau, MD       Or  . ondansetron [facility](ZOFRAN) injection 4 mg  4 mg Intravenous Q6H PRN Teryl LucyJoshua Landau, MD      . polyethylene glycol (MIRALAX / GLYCOLAX) packet 17 g  17 g Oral Daily PRN Teryl LucyJoshua Landau, MD      . pravastatin (PRAVACHOL) tablet 20 mg  20 mg Oral q1800 Lorretta HarpXilin Niu, MD      . QUEtiapine (SEROQUEL) tablet 12.5 mg  12.5 mg Oral QHS Leatha Gildingostin M Gherghe, MD   12.5 mg at 11/26/15 2110  . rivastigmine (EXELON) capsule 6 mg  6 mg Oral Q breakfast Lorretta HarpXilin Niu, MD      . senna [facility](SENOKOT) tablet 8.6 mg  1 tablet Oral BID Teryl LucyJoshua Landau, MD   8.6 mg at 11/26/15 1531  . vitamin B-12 (CYANOCOBALAMIN) tablet 1,000 mcg  1,000 mcg Oral Daily Lorretta HarpXilin Niu, MD   1,000 mcg at 11/28/15 1036     Discharge Medications: Please see discharge summary for a list of discharge medications.  Relevant Imaging Results:  Relevant Lab Results:   Additional Information SS#: [id number]  Terald Sleeperakiyah T Prashant Glosser, LCSW

## 2015-11-28 NOTE — Clinical Social Work Placement (Signed)
   CLINICAL SOCIAL WORK PLACEMENT  NOTE  Date:  11/28/2015  Patient Details  Name: Kristen Nguyen MRN: [id number] Date of Birth: [birth year]  Clinical Social Work is seeking post-discharge placement for this patient at the   level of care (*CSW will initial, date and re-position this form in  chart as items are completed):      Patient/family provided with [facility] Clinical Social Work Department's list of facilities offering this level of care within the geographic area requested by the patient (or if unable, by the patient's family).      Patient/family informed of their freedom to choose among providers that offer the needed level of care, that participate in Medicare, Medicaid or managed care program needed by the patient, have an available bed and are willing to accept the patient.      Patient/family informed of [facility]'s ownership interest in [facility] and [facility], as well as of the fact that they are under no obligation to receive care at these facilities.  PASRR submitted to EDS on       PASRR number received on       Existing PASRR number confirmed on       FL2 transmitted to all facilities in geographic area requested by pt/family on       FL2 transmitted to all facilities within larger geographic area on       Patient informed that his/her managed care company has contracts with or will negotiate with certain facilities, including the following:            Patient/family informed of bed offers received.  Patient chooses bed at       Physician recommends and patient chooses bed at      Patient to be transferred to   on  .  Patient to be transferred to facility by       Patient family notified on   of transfer.  Name of family member notified:        PHYSICIAN       Additional Comment:    _______________________________________________ Terald Sleeperakiyah T Shaketa Serafin, LCSW 11/28/2015, 11:49 AM

## 2015-11-28 NOTE — Clinical Social Work Placement (Signed)
   CLINICAL SOCIAL WORK PLACEMENT  NOTE  Date:  11/28/2015  Patient Details  Name: Kristen Nguyen MRN: [id number] Date of Birth: 16-Oct-1933  Clinical Social Work is seeking post-discharge placement for this patient at the Skilled  Nursing Facility level of care (*CSW will initial, date and re-position this form in  chart as items are completed):  Yes   Patient/family provided with [place] Clinical Social Work Department's list of facilities offering this level of care within the geographic area requested by the patient (or if unable, by the patient's family).  Yes   Patient/family informed of their freedom to choose among providers that offer the needed level of care, that participate in Medicare, Medicaid or managed care program needed by the patient, have an available bed and are willing to accept the patient.  Yes   Patient/family informed of [facility]'s ownership interest in [facility] and [facility], as well as of the fact that they are under no obligation to receive care at these facilities.  PASRR submitted to EDS on 11/28/15     PASRR number received on 11/28/15     Existing PASRR number confirmed on       FL2 transmitted to all facilities in geographic area requested by pt/family on 11/28/15     FL2 transmitted to all facilities within larger geographic area on       Patient informed that his/her managed care company has contracts with or will negotiate with certain facilities, including the following:            Patient/family informed of bed offers received.  Patient chooses bed at       Physician recommends and patient chooses bed at      Patient to be transferred to   on  .  Patient to be transferred to facility by       Patient family notified on   of transfer.  Name of family member notified:        PHYSICIAN Please prepare priority discharge summary, including medications, Please prepare prescriptions, Please sign FL2     Additional Comment:     _______________________________________________ Terald Sleeperakiyah T Jaimie Pippins, LCSW 11/28/2015, 12:08 PM

## 2015-11-28 NOTE — Evaluation (Addendum)
Clinical/Bedside Swallow Evaluation Patient Details  Name: Kristen Nguyen MRN: [id number] Date of Birth: 05-01-1934  Today's Date: 11/28/2015 Time: SLP Start Time (ACUTE ONLY): 0944 SLP Stop Time (ACUTE ONLY): 0959 SLP Time Calculation (min) (ACUTE ONLY): 15 min  Past Medical History:  Past Medical History  Diagnosis Date  . Alzheimer's disease   . Essential hypertension   . HLD (hyperlipidemia)   . Depression    Past Surgical History:  Past Surgical History  Procedure Laterality Date  . Abdominal hysterectomy     HPI:  Kristen Nguyen is a 82 y.o. female with medical history significant of dementia, hypertension, hyperlipidemia, depression, who presents with left hip pain after fall and sustained mildly displaced subcapital fracture through the left femoral neck . CT head negative for acute abnormalities. Chest x-ray showed bilateral atelectasis. Underwent left hip arthroplasty.   Assessment / Plan / Recommendation Clinical Impression  Bedside swallow assessment completed wih spouse at bedside. Husband denies s/s aspiration or oral holding prior to admission. Pt moans in pain with upright movement but calms once positioned. Mild throat clears following thin liquid x 2 (once with straw). Pt is endentulous and eats softer textures at home (not puree per husand). Recommend Dys 2 texture and thin liquids, small sips via straw, pills whole in applesauce and full supervision/assist. Briefly discussed progression of dementia and effects on swallow function. ST will follow up for tolerance of texture and liquids.         Aspiration Risk   (mild-mod)    Diet Recommendation Dysphagia 2 (Fine chop);Thin liquid   Liquid Administration via: Straw;Cup Medication Administration: Whole meds with puree Supervision: Patient able to self feed;Full supervision/cueing for compensatory strategies Compensations: Slow rate;Small sips/bites;Minimize environmental distractions;Lingual sweep for clearance of  pocketing Postural Changes: Seated upright at 90 degrees    Other  Recommendations Oral Care Recommendations: Oral care BID   Follow up Recommendations   (TBD)    Frequency and Duration min 2x/week  2 weeks       Prognosis Prognosis for Safe Diet Advancement:  (fair-good) Barriers to Reach Goals: Cognitive deficits      Swallow Study   General HPI: Kristen Nguyen is a 82 y.o. female with medical history significant of dementia, hypertension, hyperlipidemia, depression, who presents with left hip pain after fall and sustained mildly displaced subcapital fracture through the left femoral neck . CT head negative for acute abnormalities. Chest x-ray showed bilateral atelectasis. Underwent left hip arthroplasty. Type of Study: Bedside Swallow Evaluation Previous Swallow Assessment: none Diet Prior to this Study: Thin liquids (clear liquids) Temperature Spikes Noted: No Respiratory Status: Room air History of Recent Intubation: Yes Length of Intubations (days):  (hours for surgery) Date extubated: 11/26/15 Behavior/Cognition: Alert;Cooperative;Pleasant mood;Confused;Requires cueing Oral Cavity Assessment: Within Functional Limits Oral Care Completed by SLP: No Oral Cavity - Dentition: Edentulous Vision: Functional for self-feeding Patient Positioning: Upright in bed Baseline Vocal Quality: Normal Volitional Cough: Weak Volitional Swallow: Able to elicit    Oral/Motor/Sensory Function Overall Oral Motor/Sensory Function: Within functional limits   Ice Chips Ice chips: Not tested   Thin Liquid Thin Liquid: Impaired Presentation: Straw;Cup Pharyngeal  Phase Impairments: Throat Clearing - Immediate    Nectar Thick Nectar Thick Liquid: Not tested   Honey Thick Honey Thick Liquid: Not tested   Puree Puree: Within functional limits   Solid   GO   Solid: Impaired Oral Phase Functional Implications:  (prolonged mastication-no dentition)        Roque CashLitaker, Breck CoonsLisa  Willis 11/28/2015,10:22  AM  Breck Coons Lonell Face.Ed [employer] [phone]

## 2015-11-29 ENCOUNTER — Encounter (HOSPITAL_COMMUNITY): Payer: Self-pay | Admitting: Orthopedic Surgery

## 2015-11-29 ENCOUNTER — Inpatient Hospital Stay (HOSPITAL_COMMUNITY): Payer: PPO

## 2015-11-29 DIAGNOSIS — R9431 Abnormal electrocardiogram [ECG] [EKG]: Secondary | ICD-10-CM

## 2015-11-29 LAB — ECHOCARDIOGRAM COMPLETE
CHL CUP MV DEC (S): 264
CHL CUP TV REG PEAK VELOCITY: 338 cm/s
E/e' ratio: 11.89
EWDT: 264 ms
FS: 51 % — AB (ref 28–44)
Height: 69 in
IV/PV OW: 1.42
LA ID, A-P, ES: 41 cm
LA vol: 64.9 cm3
LADIAMINDEX: 2.37 cm/m2
LAVOLA4C: 59.8 mL
LAVOLIN: 37.5 mL/m2
LDCA: 2.54 cm2
LEFT ATRIUM END SYS DIAM: 41 cm
LV E/e' medial: 11.89
LV TDI E'LATERAL: 7.47
LV TDI E'MEDIAL: 7
LVEEAVG: 11.89
LVELAT: 7.47 cm/s
LVOTD: 18 mm
MV Peak grad: 3 mmHg
MV pk A vel: 123 m/s
MVPKEVEL: 88.8 m/s
PW: 12 mm — AB (ref 0.6–1.1)
TR max vel: 338 m/s
Weight: 2119.94 oz

## 2015-11-29 LAB — CBC
HCT: 27.6 % — ABNORMAL LOW (ref 36.0–46.0)
Hemoglobin: 8.6 g/dL — ABNORMAL LOW (ref 12.0–15.0)
MCH: 28.4 pg (ref 26.0–34.0)
MCHC: 31.2 g/dL (ref 30.0–36.0)
MCV: 91.1 fL (ref 78.0–100.0)
PLATELETS: 224 10*3/uL (ref 150–400)
RBC: 3.03 MIL/uL — AB (ref 3.87–5.11)
RDW: 13.6 % (ref 11.5–15.5)
WBC: 8.5 10*3/uL (ref 4.0–10.5)

## 2015-11-29 NOTE — Care Management Important Message (Signed)
Important Message  Patient Details  Name: Riki RuskLois Devincenzi MRN: [id number] Date of Birth: 13-Mar-1934   Medicare Important Message Given:  Yes    Bernadette HoitShoffner, Dallis Czaja Coleman 11/29/2015, 12:04 PM

## 2015-11-29 NOTE — Clinical Social Work Note (Addendum)
11:27am- CSW received decision from SNF that mitts are, in fact, considered restraints and the patient would need to be 24-hours mitten free before being admitted to SNF.  CSW updated RN.  RN states mittens were removed after morning progression (around 9am).  [facility] will [facility] pending continued medical readiness.  MD updated.  10:14am- CSW was notified of patient's medical readiness.  Patient will be discharged to [facility] (SNF) at time of discharge.  Per report, patient has mitts on for safety.  CSW contacted SNF to inquire if they will accept patient with mitts.  Of note, typically mitts are considered a restraining device and prohibits the patient from being admitted to SNF.  The patient must be restraint-free for 24 hours prior to SNF admission.  CSW awaiting a return call from [place] [facility] for decision on admission today and mitts.  Vickii PennaGina Hoda Hon, LCSW [phone]  [address] Licensed Clinical Social Worker

## 2015-11-29 NOTE — Progress Notes (Signed)
Speech Language Pathology Dysphagia Treatment Patient Details Name: Kristen Nguyen MRN: [id number] DOB: Mar 21, 1934 Today's Date: 11/29/2015 Time: 0810-0822 SLP Time Calculation (min) (ACUTE ONLY): 12 min  Assessment / Plan / Recommendation Clinical Impression   Pt demonstrates adequate tolerance of thin liquids and oatmeal, but consumes solids only with max encouragement and refused the dys 2 textures on the tray. Feel pt is able to orally manipulate these textures given function with liquids and oatmeal, but refuses due to appetite and cognitive impairment. Staff and family are relying on nutritional supplements for now. Encouraged importance of upright posture and self feeding in setting of hip fx and dementia. Husband nodded but seemed skeptical. Will continue to follow for education and diet tolerance.     Diet Recommendation    Dys 2/thin   SLP Plan Continue with current plan of care   Pertinent Vitals/Pain repositioned   Swallowing Goals     General Behavior/Cognition: Alert HPI: Kristen Nguyen is a 82 y.o. female with medical history significant of dementia, hypertension, hyperlipidemia, depression, who presents with left hip pain after fall and sustained mildly displaced subcapital fracture through the left femoral neck . CT head negative for acute abnormalities. Chest x-ray showed bilateral atelectasis. Underwent left hip arthroplasty.  Oral Cavity - Oral Hygiene     Dysphagia Treatment Family/Caregiver Educated: husband Treatment Methods: Skilled observation;Compensation strategy training;Patient/caregiver education Patient observed directly with PO's: Yes Type of PO's observed: Thin liquids;Dysphagia 1 (puree) Feeding: Needs assist;Able to feed self Liquids provided via: Straw Type of cueing: Verbal;Tactile;Visual Amount of cueing: Minimal   GO    Harlon DittyBonnie Julian Medina, MA CCC-SLP [phone]  Claudine MoutonDeBlois, Leonarda Leis Caroline 11/29/2015, 9:31 AM

## 2015-11-29 NOTE — Progress Notes (Signed)
Patient ID: Kristen Nguyen, female   DOB: 1933-09-11, 82 y.o.   MRN: [id number]     Subjective:  Patient reports pain as mild.  Patient in bed and in no acute distress follows commands but is not aware of time and place.  No family at the bed side   Objective:   VITALS:   Filed Vitals:   11/28/15 1449 11/28/15 2133 11/29/15 0705 11/29/15 1500  BP: 112/76 122/46 120/58 126/60  Pulse: 77 79 77 78  Temp: 98.9 F (37.2 C) 98.6 F (37 C) 98.8 F (37.1 C) 98.6 F (37 C)  TempSrc: Axillary Oral Oral Oral  Resp: 18 16 16 16   Height:      Weight:      SpO2: 100% 100% 100% 100%    ABD soft Sensation intact distally Dorsiflexion/Plantar flexion intact Incision: dressing C/D/I and no drainage Wound good dry dressing applied  Lab Results  Component Value Date   WBC 8.5 11/29/2015   HGB 8.6* 11/29/2015   HCT 27.6* 11/29/2015   MCV 91.1 11/29/2015   PLT 224 11/29/2015   BMET    Component Value Date/Time   NA 138 11/28/2015 0546   K 3.5 11/28/2015 0546   CL 106 11/28/2015 0546   CO2 25 11/28/2015 0546   GLUCOSE 107* 11/28/2015 0546   BUN 11 11/28/2015 0546   CREATININE 0.82 11/28/2015 0546   CALCIUM 8.1* 11/28/2015 0546   GFRNONAA >60 11/28/2015 0546   GFRAA >60 11/28/2015 0546     Assessment/Plan: 3 Days Post-Op   Principal Problem:   Closed left hip fracture (HCC) Active Problems:   Alzheimer's disease   Essential hypertension   HLD (hyperlipidemia)   Depression   Abnormal EKG   Advance diet Up with therapy Continue plan per medicine WBAT Dry dressing PRN Okay for SNF when clear with medicine   DOUGLAS PARRY, BRANDON 11/29/2015, 4:11 PM  Agree with above.   Teryl LucyJoshua Hezekiah Veltre, MD Cell [phone]

## 2015-11-29 NOTE — Progress Notes (Signed)
PROGRESS NOTE  Kristen RuskLois Nguyen ZOX:[id number] DOB: 04/24/1934 DOA: 11/25/2015 PCP: No primary care provider on file.   LOS: 2 days  Brief Narrative: 82 y.o. female with medical history significant of dementia, hypertension, hyperlipidemia, depression, who presented with left hip pain after fall. She is status post repair 6/2. Hospitalization complicated by dementia with behavioral disturbances  Assessment & Plan: Principal Problem:   Closed left hip fracture (HCC) Active Problems:   Alzheimer's disease   Essential hypertension   HLD (hyperlipidemia)   Depression   Abnormal EKG   Closed left hip fracture (HCC)  - As evidenced by x-ray - orthopedic surgery consulted, she is s/p left hip press-fit hemiarthroplasty on 6/2 - PT, monitor CBC post op, DVT prophylaxis per ortho, on Lovenox  Dementia with behavioral disturbances - continue Eexlon - prn ativan for agitation - started seroquel low dose daily at bedtime, seems to be helping the patient is less agitated overnight - SNF tomorrow once off restraints for 24 h  Acute blood loss anemia - downtred but overall stable. No need for transfusion. Monitor.   Essential hypertension - continue amlodipine - IV hydralazine when necessary - BP 120/58 this morning, continue current regimen   HLD - continue home medications: Pravastatin  Depression - Continue Prozac  Abnormal EKG  - EKG showed left bundle blockade, no old EKG to compare with. Patient does not seem to have chest pain, less likely to have heat attack. - Troponin 3 negative - Start aspirin - 2-D echocardiogram pending   DVT prophylaxis: SCD Code Status: Full Family Communication: husband bedside Disposition Plan: SNF 1-2 days  Consultants:   orthoepdic surgery   Procedures:  Left hip press-fit hemiarthroplasty, unipolar, Dr. Dion SaucierLandau, 6/2  Antimicrobials:  Peri op   Subjective: - confused, no complaints. Seems comfortable.  Objective: Filed Vitals:   11/28/15 0516 11/28/15 1449 11/28/15 2133 11/29/15 0705  BP: 143/54 112/76 122/46 120/58  Pulse: 86 77 79 77  Temp: 98.2 F (36.8 C) 98.9 F (37.2 C) 98.6 F (37 C) 98.8 F (37.1 C)  TempSrc: Oral Axillary Oral Oral  Resp: 16 18 16 16   Height:      Weight:      SpO2: 100% 100% 100% 100%    Intake/Output Summary (Last 24 hours) at 11/29/15 1137 Last data filed at 11/29/15 0829  Gross per 24 hour  Intake 2314.5 ml  Output    700 ml  Net 1614.5 ml   Filed Weights   11/25/15 2125 11/26/15 0132  Weight: 68.04 kg (150 lb) 60.1 kg (132 lb 7.9 oz)   Examination: Constitutional: calm, NAD  Filed Vitals:   11/28/15 0516 11/28/15 1449 11/28/15 2133 11/29/15 0705  BP: 143/54 112/76 122/46 120/58  Pulse: 86 77 79 77  Temp: 98.2 F (36.8 C) 98.9 F (37.2 C) 98.6 F (37 C) 98.8 F (37.1 C)  TempSrc: Oral Axillary Oral Oral  Resp: 16 18 16 16   Height:      Weight:      SpO2: 100% 100% 100% 100%   Respiratory: clear to auscultation bilaterally, no wheezing, no crackles.  Cardiovascular: Regular rate and rhythm, no murmurs / rubs / gallops. No LE edema.   Abdomen: no tenderness. Bowel sounds positive.  Musculoskeletal: Decreased muscle tone.  Neurologic: non focal    Data Reviewed: I have personally reviewed following labs and imaging studies  CBC:  Recent Labs Lab 11/25/15 2215 11/26/15 0221 11/27/15 0643 11/28/15 0546 11/29/15 0501  WBC 14.4* 14.3* 11.7* 10.8* 8.5  HGB 12.5 11.8* 10.1* 9.1* 8.6*  HCT 39.2 37.5 32.8* 29.2* 27.6*  MCV 91.0 91.0 92.9 90.7 91.1  PLT 314 294 232 198 224   Basic Metabolic Panel:  Recent Labs Lab 11/25/15 2215 11/26/15 0221 11/27/15 0643 11/28/15 0546  NA 140 140 138 138  K 4.2 3.8 3.8 3.5  CL 106 108 104 106  CO2 GLUCOSE 89 129* 123* 107*  BUN CREATININE 0.96 0.94 0.79 0.82  CALCIUM 9.1 8.8* 8.4* 8.1*   Coagulation Profile:  Recent Labs Lab 11/25/15 2215  INR 1.17   Cardiac  Enzymes:  Recent Labs Lab 11/26/15 0359 11/26/15 0924 11/26/15 1557  TROPONINI <0.03 <0.03 <0.03   Lipid Profile: No results for input(s): CHOL, HDL, LDLCALC, TRIG, CHOLHDL, LDLDIRECT in the last 72 hours. Sepsis Labs: Invalid input(s): PROCALCITONIN, LACTICIDVEN  Recent Results (from the past 240 hour(s))  Surgical pcr screen     Status: None   Collection Time: 11/26/15  2:00 AM  Result Value Ref Range Status   MRSA, PCR NEGATIVE NEGATIVE Final   Staphylococcus aureus NEGATIVE NEGATIVE Final    Comment:        The Xpert SA Assay (FDA approved for NASAL specimens in patients over 61 years of age), is one component of a comprehensive surveillance program.  Test performance has been validated by [facility] for patients greater than or equal to 56 year old. It is not intended to diagnose infection nor to guide or monitor treatment.     Radiology Studies: No results found. Scheduled Meds: . amLODipine  5 mg Oral Daily  . cholecalciferol  1,000 Units Oral Daily  . docusate sodium  100 mg Oral BID  . enoxaparin (LOVENOX) injection  40 mg Subcutaneous Q24H  . feeding supplement (ENSURE ENLIVE)  237 mL Oral BID BM  . ferrous sulfate  325 mg Oral TID PC  . FLUoxetine  20 mg Oral Daily  . pravastatin  20 mg Oral q1800  . QUEtiapine  12.5 mg Oral QHS  . rivastigmine  6 mg Oral Q breakfast  . senna  1 tablet Oral BID  . vitamin B-12  1,000 mcg Oral Daily   Continuous Infusions: . sodium chloride 75 mL/hr (11/28/15 0559)   Kristen Pert, MD, PhD [facility] Pager [phone]  If 7PM-7AM, please contact night-coverage [email or web address] Password [facility] 11/29/2015, 11:37 AM

## 2015-11-30 LAB — CBC
HCT: 30.4 % — ABNORMAL LOW (ref 36.0–46.0)
HEMOGLOBIN: 9.4 g/dL — AB (ref 12.0–15.0)
MCH: 28 pg (ref 26.0–34.0)
MCHC: 30.9 g/dL (ref 30.0–36.0)
MCV: 90.5 fL (ref 78.0–100.0)
Platelets: 279 10*3/uL (ref 150–400)
RBC: 3.36 MIL/uL — AB (ref 3.87–5.11)
RDW: 13.5 % (ref 11.5–15.5)
WBC: 7.2 10*3/uL (ref 4.0–10.5)

## 2015-11-30 MED ORDER — QUETIAPINE FUMARATE 25 MG PO TABS: 12.5000 mg | ORAL_TABLET | Freq: Every day | ORAL | Status: AC

## 2015-11-30 NOTE — Progress Notes (Signed)
PT Cancellation Note  Patient Details Name: Kristen RuskLois Lyssy MRN: [id number] DOB: 02/20/1934   Cancelled Treatment:    Reason Eval/Treat Not Completed: Other (comment) (Pt d/c'd and awaiting transport, currently agitated will defer to avoid interferance with scheduled d/c.  )   Jafeth Mustin Artis DelayJ Glenard Keesling 11/30/2015, 11:29 AM Joycelyn RuaAimee Kristopher Attwood, PTA pager [phone]

## 2015-11-30 NOTE — Progress Notes (Signed)
Patient ID: Riki RuskLois Coba, female   DOB: 1933/07/30, 82 y.o.   MRN: [id number]     Subjective:  Patient reports pain as mild.  Somewhat agitated but does follow commands not in acute distress No family at the bed side  Objective:   VITALS:   Filed Vitals:   11/28/15 2133 11/29/15 0705 11/29/15 1500 11/29/15 2023  BP: 122/46 120/58 126/60 130/44  Pulse: 79 77 78 80  Temp: 98.6 F (37 C) 98.8 F (37.1 C) 98.6 F (37 C) 98.7 F (37.1 C)  TempSrc: Oral Oral Oral Oral  Resp: 16 16 16 17   Height:      Weight:      SpO2: 100% 100% 100% 96%    ABD soft Sensation intact distally Dorsiflexion/Plantar flexion intact Incision: dressing C/D/I and no drainage Wound good and no sign of infection  Lab Results  Component Value Date   WBC 8.5 11/29/2015   HGB 8.6* 11/29/2015   HCT 27.6* 11/29/2015   MCV 91.1 11/29/2015   PLT 224 11/29/2015   BMET    Component Value Date/Time   NA 138 11/28/2015 0546   K 3.5 11/28/2015 0546   CL 106 11/28/2015 0546   CO2 25 11/28/2015 0546   GLUCOSE 107* 11/28/2015 0546   BUN 11 11/28/2015 0546   CREATININE 0.82 11/28/2015 0546   CALCIUM 8.1* 11/28/2015 0546   GFRNONAA >60 11/28/2015 0546   GFRAA >60 11/28/2015 0546     Assessment/Plan: 4 Days Post-Op   Principal Problem:   Closed left hip fracture (HCC) Active Problems:   Alzheimer's disease   Essential hypertension   HLD (hyperlipidemia)   Depression   Abnormal EKG   Advance diet Up with therapy Continue plan per medicine WBAT Okay for SNF  DOUGLAS Janace LittenRRY, BRANDON 11/30/2015, 7:15 AM  Agree with above.   Teryl LucyJoshua Ahlivia Salahuddin, MD Cell [phone]

## 2015-11-30 NOTE — Clinical Social Work Note (Addendum)
Patient will discharge today per MD order. Patient will discharge to: [facility] RN to call report prior to transportation to: [phone] Transportation: [facility]- to be called after auth received from [employer] & discharge order/summary complete from MD. - projected at 11  MD states CBC is back and WNL.  Patient will dc today. CSW updated husband re: discharge plans. Husband wishes to return to the hospital prior to patient leaving to pack up room belongings.  Advised that transportation should arrive for pick-up between 12-1 given they are running on schedule.  Husband agreeable to discharge plans.    Vickii PennaGina Vernestine Brodhead, LCSW [phone]  5N1-9, 2S 15-16 and Psychiatric Service Line  Licensed Clinical Social Worker

## 2015-11-30 NOTE — Discharge Summary (Signed)
Physician Discharge Summary  Kristen Nguyen ZOX:[id number] DOB: 1933-11-01 DOA: 11/25/2015  PCP: No primary care provider on file.  Admit date: 11/25/2015 Discharge date: 11/30/2015  Time spent: > 30 minutes  Recommendations for Outpatient Follow-up:  1.  Follow up with Dr. Dion Saucier in 2 weeks 2. Discharge to SNF 3. Patient started on low dose Seroquel  Discharge Diagnoses:  Principal Problem:   Closed left hip fracture (HCC) Active Problems:   Alzheimer's disease   Essential hypertension   HLD (hyperlipidemia)   Depression   Abnormal EKG  Discharge Condition: stable  Diet recommendation: Dysphagia 2   Filed Weights   11/25/15 2125 11/26/15 0132  Weight: 68.04 kg (150 lb) 60.1 kg (132 lb 7.9 oz)    History of present illness:  See H&P, Labs, Consult and Test reports for all details in brief, patient is a 82 y.o. female with medical history significant of dementia, hypertension, hyperlipidemia, depression, who presented with left hip pain after fall. She is status post repair 6/2.  Hospital Course:  Closed left hip fracture (HCC)- As evidenced by x-ray, orthopedic surgery consulted, she is s/p left hip press-fit hemiarthroplasty on 6/2. She is to follow-up with Dr. Dion Saucier in about couple weeks.Will be on Lovenox for DVT prophylaxis Dementia - continue home medications, patient started on low-dose Seroquel and tolerated it well. Continue. Acute blood loss anemia - patient with mild decrease in hemoglobin following the surgery, CBC stable on the day of discharge and actually shows improvement. Repeat the CBC next week. Essential hypertension - continue amlodipine HLD - continue home medications: Pravastatin Depression - Continue Prozac Abnormal EKG - EKG showed left bundle blockade, no old EKG to compare with. 2D echo as below, increased PA pressure, systolic function normal. Asymptomatic. Troponin 3 negative  Procedures:  2D echo Study Conclusions - Left ventricle: The  cavity size was normal. There was moderate focal basal and mild concentric hypertrophy of the left ventricle. Systolic function was normal. The estimated ejection fraction was in the range of 60% to 65%. Wall motion was normal; there were no regional wall motion abnormalities. Doppler parameters are consistent with abnormal left ventricular relaxation (grade 1 diastolic dysfunction). Doppler parameters are consistent with elevated ventricular end-diastolic filling pressure.   Consultations:  Orthopedic surgery   Discharge Exam: Filed Vitals:   11/29/15 0705 11/29/15 1500 11/29/15 2023 11/30/15 0440  BP: 120/58 126/60 130/44 140/52  Pulse: 77 78 80 77  Temp: 98.8 F (37.1 C) 98.6 F (37 C) 98.7 F (37.1 C) 97.7 F (36.5 C)  TempSrc: Oral Oral Oral Axillary  Resp: 16 16 17 16   Height:      Weight:      SpO2: 100% 100% 96% 100%    General: NAD Cardiovascular: RRR Respiratory: CTA biL  Discharge Instructions Activity:  As tolerated   Get Medicines reviewed and adjusted: Please take all your medications with you for your next visit with your Primary MD  Please request your Primary MD to go over all hospital tests and procedure/radiological results at the follow up, please ask your Primary MD to get all Hospital records sent to his/her office.  If you experience worsening of your admission symptoms, develop shortness of breath, life threatening emergency, suicidal or homicidal thoughts you must seek medical attention immediately by calling 911 or calling your MD immediately if symptoms less severe.  You must read complete instructions/literature along with all the possible adverse reactions/side effects for all the Medicines you take and that have been prescribed  to you. Take any new Medicines after you have completely understood and accpet all the possible adverse reactions/side effects.   Do not drive when taking Pain medications.   Do not take more than prescribed Pain, Sleep  and Anxiety Medications  Special Instructions: If you have smoked or chewed Tobacco in the last 2 yrs please stop smoking, stop any regular Alcohol and or any Recreational drug use.  Wear Seat belts while driving.  Please note  You were cared for by a hospitalist during your hospital stay. Once you are discharged, your primary care physician will handle any further medical issues. Please note that NO REFILLS for any discharge medications will be authorized once you are discharged, as it is imperative that you return to your primary care physician (or establish a relationship with a primary care physician if you do not have one) for your aftercare needs so that they can reassess your need for medications and monitor your lab values. Discharge Instructions    Posterior total hip precautions    Complete by:  As directed      Weight bearing as tolerated    Complete by:  As directed             Medication List    TAKE these medications        amLODipine 5 MG tablet  Commonly known as:  NORVASC  Take 5 mg by mouth daily.     aspirin EC 81 MG tablet  Take 81 mg by mouth daily.     baclofen 10 MG tablet  Commonly known as:  LIORESAL  Take 1 tablet (10 mg total) by mouth 3 (three) times daily. As needed for muscle spasm     cholecalciferol 1000 units tablet  Commonly known as:  VITAMIN D  Take 2,000 Units by mouth daily.     docusate sodium 100 MG capsule  Commonly known as:  COLACE  Take 100 mg by mouth daily.     enoxaparin 40 MG/0.4ML injection  Commonly known as:  LOVENOX  Inject 0.4 mLs (40 mg total) into the skin daily.     FLUoxetine 20 MG tablet  Commonly known as:  PROZAC  Take 20 mg by mouth daily.     HYDROcodone-acetaminophen 5-325 MG tablet  Commonly known as:  NORCO  Take 1-2 tablets by mouth every 6 (six) hours as needed for moderate pain. MAXIMUM TOTAL ACETAMINOPHEN DOSE IS 4000 MG PER DAY     magnesium gluconate 500 MG tablet  Commonly known as:   MAGONATE  Take 500 mg by mouth daily.     pravastatin 20 MG tablet  Commonly known as:  PRAVACHOL  Take 20 mg by mouth daily.     QUEtiapine 25 MG tablet  Commonly known as:  SEROQUEL  Take 0.5 tablets (12.5 mg total) by mouth at bedtime.     rivastigmine 6 MG capsule  Commonly known as:  EXELON  Take 6 mg by mouth 2 (two) times daily.     sennosides-docusate sodium 8.6-50 MG tablet  Commonly known as:  SENOKOT-S  Take 2 tablets by mouth daily.     vitamin B-12 1000 MCG tablet  Commonly known as:  CYANOCOBALAMIN  Take 1,000 mcg by mouth daily.           Follow-up Information    Follow up with Eulas Post, MD. Schedule an appointment as soon as possible for a visit in 2 weeks.   Specialty:  Orthopedic Surgery   Contact  information:   [address] [place] [address] [phone]       The results of significant diagnostics from this hospitalization (including imaging, microbiology, ancillary and laboratory) are listed below for reference.    Significant Diagnostic Studies: Dg Chest 1 View  11/25/2015  CLINICAL DATA:  Status post fall, with left hip deformity. Concern for chest injury. Initial encounter. EXAM: CHEST 1 VIEW COMPARISON:  None. FINDINGS: The lungs are mildly hyperexpanded. Minimal bilateral atelectasis or scarring is noted. There is no evidence of pleural effusion or pneumothorax. The cardiomediastinal silhouette is borderline normal. No acute osseous abnormalities are seen. IMPRESSION: Minimal bilateral atelectasis or scarring noted. Lungs otherwise clear. Electronically Signed   By: Roanna RaiderJeffery  Chang M.D.   On: 11/25/2015 23:09   Ct Head Wo Contrast  11/26/2015  CLINICAL DATA:  Altered mental status.  Confusion and combative. EXAM: CT HEAD WITHOUT CONTRAST TECHNIQUE: Contiguous axial images were obtained from the base of the skull through the vertex without intravenous contrast. COMPARISON:  None. FINDINGS: Brain: Generalized cerebral  atrophy, mild- moderate chronic small vessel ischemia.No intracranial hemorrhage, mass effect, or midline shift. No hydrocephalus. The basilar cisterns are patent. No evidence of territorial infarct. No intracranial fluid collection. Vascular: No hyperdense vessel or abnormal calcification. Atherosclerosis of skullbase vasculature. Skull:  Calvarium is intact. Sinuses/Orbits: Minimal mucosal thickening of the inferior right maxillary sinus. Paranasal sinuses are otherwise clear. Mastoid air cells are well aerated. Other: None. IMPRESSION: Atrophy and chronic small vessel ischemia, no acute intracranial abnormality. Electronically Signed   By: Rubye OaksMelanie  Ehinger M.D.   On: 11/26/2015 03:45   Pelvis Portable  11/26/2015  CLINICAL DATA:  Bipolar left hip arthroplasty EXAM: PORTABLE PELVIS 1-2 VIEWS COMPARISON:  None. FINDINGS: Single view shows bipolar hip arthroplasty on the left. Components appear well positioned. No radiographically detectable complication. IMPRESSION: Good appearance following left hip arthroplasty. Electronically Signed   By: Paulina FusiMark  Shogry M.D.   On: 11/26/2015 10:34   Dg Hip Unilat With Pelvis 2-3 Views Left  11/25/2015  CLINICAL DATA:  Status post unwitnessed fall, with left hip pain. Initial encounter. EXAM: DG HIP (WITH OR WITHOUT PELVIS) 2-3V LEFT COMPARISON:  None. FINDINGS: There is a mildly displaced subcapital fracture through the left femoral neck. The left femoral head remains seated at the acetabulum. The right hip joint is grossly unremarkable. No significant degenerative change is appreciated. The sacroiliac joints are unremarkable in appearance. The visualized bowel gas pattern is grossly unremarkable in appearance. IMPRESSION: Mildly displaced subcapital fracture through the left femoral neck. Electronically Signed   By: Roanna RaiderJeffery  Chang M.D.   On: 11/25/2015 23:08    Microbiology: Recent Results (from the past 240 hour(s))  Surgical pcr screen     Status: None   Collection  Time: 11/26/15  2:00 AM  Result Value Ref Range Status   MRSA, PCR NEGATIVE NEGATIVE Final   Staphylococcus aureus NEGATIVE NEGATIVE Final    Comment:        The Xpert SA Assay (FDA approved for NASAL specimens in patients over 80 years of age), is one component of a comprehensive surveillance program.  Test performance has been validated by [facility] for patients greater than or equal to 80 year old. It is not intended to diagnose infection nor to guide or monitor treatment.      Labs: Basic Metabolic Panel:  Recent Labs Lab 11/25/15 2215 11/26/15 0221 11/27/15 0643 11/28/15 0546  NA 140 140 138 138  K 4.2 3.8 3.8  3.5  CL 106 108 104 106  CO2 GLUCOSE 89 129* 123* 107*  BUN CREATININE 0.96 0.94 0.79 0.82  CALCIUM 9.1 8.8* 8.4* 8.1*   Liver Function Tests: No results for input(s): AST, ALT, ALKPHOS, BILITOT, PROT, ALBUMIN in the last 168 hours. No results for input(s): LIPASE, AMYLASE in the last 168 hours. No results for input(s): AMMONIA in the last 168 hours. CBC:  Recent Labs Lab 11/26/15 0221 11/27/15 0643 11/28/15 0546 11/29/15 0501 11/30/15 0934  WBC 14.3* 11.7* 10.8* 8.5 7.2  HGB 11.8* 10.1* 9.1* 8.6* 9.4*  HCT 37.5 32.8* 29.2* 27.6* 30.4*  MCV 91.0 92.9 90.7 91.1 90.5  PLT 294 232 198 224 279   Cardiac Enzymes:  Recent Labs Lab 11/26/15 0359 11/26/15 0924 11/26/15 1557  TROPONINI <0.03 <0.03 <0.03     Signed:  Pamella Pert  [facility] 11/30/2015, 10:03 AM

## 2015-11-30 NOTE — Clinical Social Work Placement (Signed)
   CLINICAL SOCIAL WORK PLACEMENT  NOTE  Date:  11/30/2015  Patient Details  Name: Kristen Nguyen MRN: [id number] Date of Birth: 1933/12/04  Clinical Social Work is seeking post-discharge placement for this patient at the Skilled  Nursing Facility level of care (*CSW will initial, date and re-position this form in  chart as items are completed):  Yes   Patient/family provided with [facility] Work Department's list of facilities offering this level of care within the geographic area requested by the patient (or if unable, by the patient's family).  Yes   Patient/family informed of their freedom to choose among providers that offer the needed level of care, that participate in Medicare, Medicaid or managed care program needed by the patient, have an available bed and are willing to accept the patient.  Yes   Patient/family informed of [facility]'s ownership interest in [facility] and [facility], as well as of the fact that they are under no obligation to receive care at these facilities.  PASRR submitted to EDS on 11/28/15     PASRR number received on 11/28/15     Existing PASRR number confirmed on       FL2 transmitted to all facilities in geographic area requested by pt/family on 11/28/15     FL2 transmitted to all facilities within larger geographic area on       Patient informed that his/her managed care company has contracts with or will negotiate with certain facilities, including the following:        Yes   Patient/family informed of bed offers received.  Patient [facility] bed [facility]     Physician recommends and patient chooses bed at      Patient to be [facility] on 11/30/15.  Patient to be transferred to facility by [facility]     Patient family notified on 11/30/15 of transfer.  Name of family member notified:  husband at bedside updated by RN     PHYSICIAN Please prepare priority discharge summary,  including medications, Please prepare prescriptions, Please sign FL2     Additional Comment:    _______________________________________________ Rondel BatonIngle, Chae Shuster C, LCSW 11/30/2015, 10:06 AM

## 2015-11-30 NOTE — Progress Notes (Signed)
Report called to Verdia KubaWanda Johnson, LPN  [facility] receiving nurse.  Attempted to reach husband Dorene SorrowJerry but not joinable at  [phone]. Daughjter in law Virgie DadAngela Reynolds at the bedside made aware of discharge plan and transportation arrangement  to [facility] for 11 am

## 2015-12-19 ENCOUNTER — Encounter (HOSPITAL_COMMUNITY)
Admission: AD | Disposition: A | Discharge status: Skilled Nursing Facility | Payer: Self-pay | Source: Other Acute Inpatient Hospital | Attending: Internal Medicine

## 2015-12-19 ENCOUNTER — Inpatient Hospital Stay (HOSPITAL_COMMUNITY): Payer: PPO | Admitting: Certified Registered Nurse Anesthetist

## 2015-12-19 ENCOUNTER — Inpatient Hospital Stay (HOSPITAL_COMMUNITY): Payer: PPO

## 2015-12-19 ENCOUNTER — Inpatient Hospital Stay (HOSPITAL_COMMUNITY)
Admission: AD | Admit: 2015-12-19 | Discharge: 2015-12-23 | DRG: 469 | Disposition: A | Discharge status: Skilled Nursing Facility | Payer: PPO | Source: Other Acute Inpatient Hospital | Attending: Internal Medicine | Admitting: Internal Medicine

## 2015-12-19 ENCOUNTER — Encounter (HOSPITAL_COMMUNITY): Payer: Self-pay | Admitting: Orthopedic Surgery

## 2015-12-19 DIAGNOSIS — F419 Anxiety disorder, unspecified: Secondary | ICD-10-CM | POA: Diagnosis present

## 2015-12-19 DIAGNOSIS — I272 Other secondary pulmonary hypertension: Secondary | ICD-10-CM | POA: Diagnosis present

## 2015-12-19 DIAGNOSIS — I071 Rheumatic tricuspid insufficiency: Secondary | ICD-10-CM | POA: Diagnosis present

## 2015-12-19 DIAGNOSIS — F0280 Dementia in other diseases classified elsewhere without behavioral disturbance: Secondary | ICD-10-CM | POA: Diagnosis present

## 2015-12-19 DIAGNOSIS — E876 Hypokalemia: Secondary | ICD-10-CM | POA: Diagnosis not present

## 2015-12-19 DIAGNOSIS — E785 Hyperlipidemia, unspecified: Secondary | ICD-10-CM | POA: Diagnosis present

## 2015-12-19 DIAGNOSIS — D649 Anemia, unspecified: Secondary | ICD-10-CM | POA: Diagnosis present

## 2015-12-19 DIAGNOSIS — S72001A Fracture of unspecified part of neck of right femur, initial encounter for closed fracture: Secondary | ICD-10-CM | POA: Diagnosis present

## 2015-12-19 DIAGNOSIS — D62 Acute posthemorrhagic anemia: Secondary | ICD-10-CM | POA: Diagnosis not present

## 2015-12-19 DIAGNOSIS — W19XXXA Unspecified fall, initial encounter: Secondary | ICD-10-CM | POA: Diagnosis not present

## 2015-12-19 DIAGNOSIS — A419 Sepsis, unspecified organism: Secondary | ICD-10-CM | POA: Diagnosis not present

## 2015-12-19 DIAGNOSIS — G309 Alzheimer's disease, unspecified: Secondary | ICD-10-CM | POA: Diagnosis present

## 2015-12-19 DIAGNOSIS — M25551 Pain in right hip: Secondary | ICD-10-CM | POA: Diagnosis present

## 2015-12-19 DIAGNOSIS — Z96642 Presence of left artificial hip joint: Secondary | ICD-10-CM | POA: Diagnosis present

## 2015-12-19 DIAGNOSIS — Z967 Presence of other bone and tendon implants: Secondary | ICD-10-CM

## 2015-12-19 DIAGNOSIS — N390 Urinary tract infection, site not specified: Secondary | ICD-10-CM | POA: Diagnosis present

## 2015-12-19 DIAGNOSIS — F329 Major depressive disorder, single episode, unspecified: Secondary | ICD-10-CM | POA: Diagnosis not present

## 2015-12-19 DIAGNOSIS — Z7982 Long term (current) use of aspirin: Secondary | ICD-10-CM

## 2015-12-19 DIAGNOSIS — R509 Fever, unspecified: Secondary | ICD-10-CM | POA: Diagnosis present

## 2015-12-19 DIAGNOSIS — Z79899 Other long term (current) drug therapy: Secondary | ICD-10-CM

## 2015-12-19 DIAGNOSIS — S72002A Fracture of unspecified part of neck of left femur, initial encounter for closed fracture: Secondary | ICD-10-CM

## 2015-12-19 DIAGNOSIS — Z8781 Personal history of (healed) traumatic fracture: Secondary | ICD-10-CM

## 2015-12-19 DIAGNOSIS — G308 Other Alzheimer's disease: Secondary | ICD-10-CM | POA: Diagnosis not present

## 2015-12-19 DIAGNOSIS — I10 Essential (primary) hypertension: Secondary | ICD-10-CM | POA: Diagnosis present

## 2015-12-19 DIAGNOSIS — S72009A Fracture of unspecified part of neck of unspecified femur, initial encounter for closed fracture: Secondary | ICD-10-CM | POA: Diagnosis present

## 2015-12-19 DIAGNOSIS — Z9889 Other specified postprocedural states: Secondary | ICD-10-CM | POA: Insufficient documentation

## 2015-12-19 HISTORY — PX: HIP ARTHROPLASTY: SHX981

## 2015-12-19 HISTORY — DX: Fracture of unspecified part of neck of right femur, initial encounter for closed fracture: S72.001A

## 2015-12-19 HISTORY — DX: Fracture of unspecified part of neck of unspecified femur, initial encounter for closed fracture: S72.009A

## 2015-12-19 HISTORY — DX: Anemia, unspecified: D64.9

## 2015-12-19 LAB — CBC
HCT: 32.2 % — ABNORMAL LOW (ref 36.0–46.0)
HEMOGLOBIN: 9.8 g/dL — AB (ref 12.0–15.0)
MCH: 27.1 pg (ref 26.0–34.0)
MCHC: 30.4 g/dL (ref 30.0–36.0)
MCV: 89.2 fL (ref 78.0–100.0)
PLATELETS: 527 10*3/uL — AB (ref 150–400)
RBC: 3.61 MIL/uL — AB (ref 3.87–5.11)
RDW: 14 % (ref 11.5–15.5)
WBC: 13.7 10*3/uL — ABNORMAL HIGH (ref 4.0–10.5)

## 2015-12-19 LAB — BASIC METABOLIC PANEL
Anion gap: 8 (ref 5–15)
BUN: 20 mg/dL (ref 6–20)
CHLORIDE: 108 mmol/L (ref 101–111)
CO2: 22 mmol/L (ref 22–32)
CREATININE: 0.67 mg/dL (ref 0.44–1.00)
Calcium: 8.7 mg/dL — ABNORMAL LOW (ref 8.9–10.3)
Glucose, Bld: 114 mg/dL — ABNORMAL HIGH (ref 65–99)
POTASSIUM: 4 mmol/L (ref 3.5–5.1)
SODIUM: 138 mmol/L (ref 135–145)

## 2015-12-19 LAB — SURGICAL PCR SCREEN
MRSA, PCR: NEGATIVE
STAPHYLOCOCCUS AUREUS: NEGATIVE

## 2015-12-19 SURGERY — HEMIARTHROPLASTY, HIP, DIRECT ANTERIOR APPROACH, FOR FRACTURE
Anesthesia: General | Site: Hip | Laterality: Right

## 2015-12-19 MED ORDER — ONDANSETRON HCL 4 MG/2ML IJ SOLN: 4.0000 mg | Freq: Four times a day (QID) | INTRAMUSCULAR | Status: DC | PRN

## 2015-12-19 MED ORDER — ONDANSETRON HCL 4 MG PO TABS: 4.0000 mg | ORAL_TABLET | Freq: Four times a day (QID) | ORAL | Status: DC | PRN

## 2015-12-19 MED ORDER — ACETAMINOPHEN 325 MG PO TABS
650.0000 mg | ORAL_TABLET | Freq: Four times a day (QID) | ORAL | Status: DC | PRN
  Administered 2015-12-21 (×2): 650 mg via ORAL
  Filled 2015-12-19 (×2): qty 2

## 2015-12-19 MED ORDER — PHENYLEPHRINE 40 MCG/ML (10ML) SYRINGE FOR IV PUSH (FOR BLOOD PRESSURE SUPPORT)
PREFILLED_SYRINGE | INTRAVENOUS | Status: DC | PRN
  Administered 2015-12-19: 40 ug via INTRAVENOUS

## 2015-12-19 MED ORDER — LIDOCAINE 2% (20 MG/ML) 5 ML SYRINGE
INTRAMUSCULAR | Status: DC | PRN
  Administered 2015-12-19: 20 mg via INTRAVENOUS

## 2015-12-19 MED ORDER — BACLOFEN 10 MG PO TABS: 10.0000 mg | ORAL_TABLET | Freq: Three times a day (TID) | ORAL | Status: AC

## 2015-12-19 MED ORDER — MENTHOL 3 MG MT LOZG: 1.0000 | LOZENGE | OROMUCOSAL | Status: DC | PRN

## 2015-12-19 MED ORDER — LORAZEPAM 2 MG/ML IJ SOLN
0.2500 mg | Freq: Four times a day (QID) | INTRAMUSCULAR | Status: DC | PRN
  Administered 2015-12-19 – 2015-12-22 (×2): 0.25 mg via INTRAVENOUS
  Filled 2015-12-19 (×3): qty 1

## 2015-12-19 MED ORDER — POLYETHYLENE GLYCOL 3350 17 G PO PACK: 17.0000 g | PACK | Freq: Every day | ORAL | Status: DC | PRN

## 2015-12-19 MED ORDER — VITAMIN D 1000 UNITS PO TABS
2000.0000 [IU] | ORAL_TABLET | Freq: Every day | ORAL | Status: DC
  Administered 2015-12-20 – 2015-12-23 (×4): 2000 [IU] via ORAL
  Filled 2015-12-19 (×8): qty 2

## 2015-12-19 MED ORDER — CEFAZOLIN SODIUM-DEXTROSE 2-4 GM/100ML-% IV SOLN
2.0000 g | Freq: Four times a day (QID) | INTRAVENOUS | Status: AC
  Administered 2015-12-19 – 2015-12-20 (×3): 2 g via INTRAVENOUS
  Filled 2015-12-19 (×2): qty 100

## 2015-12-19 MED ORDER — PHENOL 1.4 % MT LIQD: 1.0000 | OROMUCOSAL | Status: DC | PRN

## 2015-12-19 MED ORDER — CEFAZOLIN SODIUM-DEXTROSE 2-4 GM/100ML-% IV SOLN
2.0000 g | INTRAVENOUS | Status: AC
  Administered 2015-12-19: 2 g via INTRAVENOUS

## 2015-12-19 MED ORDER — CEFAZOLIN SODIUM-DEXTROSE 2-4 GM/100ML-% IV SOLN
INTRAVENOUS | Status: AC
  Filled 2015-12-19: qty 100

## 2015-12-19 MED ORDER — MORPHINE SULFATE (PF) 2 MG/ML IV SOLN
2.0000 mg | INTRAVENOUS | Status: DC | PRN
  Administered 2015-12-21 – 2015-12-22 (×2): 2 mg via INTRAVENOUS
  Filled 2015-12-19 (×2): qty 1

## 2015-12-19 MED ORDER — BACLOFEN 10 MG PO TABS
10.0000 mg | ORAL_TABLET | Freq: Three times a day (TID) | ORAL | Status: DC
  Administered 2015-12-19 – 2015-12-23 (×10): 10 mg via ORAL
  Filled 2015-12-19 (×12): qty 1

## 2015-12-19 MED ORDER — POVIDONE-IODINE 10 % EX SWAB: 2.0000 "application " | Freq: Once | CUTANEOUS | Status: DC

## 2015-12-19 MED ORDER — LACTATED RINGERS IV SOLN
INTRAVENOUS | Status: DC
  Administered 2015-12-19 (×3): via INTRAVENOUS

## 2015-12-19 MED ORDER — DOCUSATE SODIUM 100 MG PO CAPS
100.0000 mg | ORAL_CAPSULE | Freq: Two times a day (BID) | ORAL | Status: DC
  Administered 2015-12-19 – 2015-12-22 (×3): 100 mg via ORAL
  Filled 2015-12-19 (×5): qty 1

## 2015-12-19 MED ORDER — MAGNESIUM GLUCONATE 500 MG PO TABS
500.0000 mg | ORAL_TABLET | Freq: Every day | ORAL | Status: DC
  Administered 2015-12-20 – 2015-12-23 (×4): 500 mg via ORAL
  Filled 2015-12-19 (×4): qty 1

## 2015-12-19 MED ORDER — FENTANYL CITRATE (PF) 250 MCG/5ML IJ SOLN
INTRAMUSCULAR | Status: AC
  Filled 2015-12-19: qty 5

## 2015-12-19 MED ORDER — ACETAMINOPHEN 650 MG RE SUPP: 650.0000 mg | Freq: Four times a day (QID) | RECTAL | Status: DC | PRN

## 2015-12-19 MED ORDER — FLUOXETINE HCL 20 MG PO CAPS
20.0000 mg | ORAL_CAPSULE | Freq: Every day | ORAL | Status: DC
  Administered 2015-12-19 – 2015-12-23 (×4): 20 mg via ORAL
  Filled 2015-12-19 (×6): qty 1

## 2015-12-19 MED ORDER — FENTANYL CITRATE (PF) 100 MCG/2ML IJ SOLN: 25.0000 ug | INTRAMUSCULAR | Status: DC | PRN

## 2015-12-19 MED ORDER — BUPIVACAINE HCL (PF) 0.25 % IJ SOLN
INTRAMUSCULAR | Status: AC
  Filled 2015-12-19: qty 30

## 2015-12-19 MED ORDER — QUETIAPINE 12.5 MG HALF TABLET
12.5000 mg | ORAL_TABLET | Freq: Every day | ORAL | Status: DC
  Administered 2015-12-19 – 2015-12-22 (×4): 12.5 mg via ORAL
  Filled 2015-12-19 (×5): qty 1

## 2015-12-19 MED ORDER — CHLORHEXIDINE GLUCONATE 4 % EX LIQD: 60.0000 mL | Freq: Once | CUTANEOUS | Status: DC

## 2015-12-19 MED ORDER — PROPOFOL 10 MG/ML IV BOLUS
INTRAVENOUS | Status: DC | PRN
  Administered 2015-12-19: 100 mg via INTRAVENOUS

## 2015-12-19 MED ORDER — SUGAMMADEX SODIUM 200 MG/2ML IV SOLN
INTRAVENOUS | Status: DC | PRN
Start: 2015-12-19 — End: 2015-12-19
  Administered 2015-12-19: 150 mg via INTRAVENOUS

## 2015-12-19 MED ORDER — ROCURONIUM BROMIDE 100 MG/10ML IV SOLN
INTRAVENOUS | Status: DC | PRN
  Administered 2015-12-19: 5 mg via INTRAVENOUS
  Administered 2015-12-19: 30 mg via INTRAVENOUS
  Administered 2015-12-19: 10 mg via INTRAVENOUS
  Administered 2015-12-19: 5 mg via INTRAVENOUS

## 2015-12-19 MED ORDER — FENTANYL CITRATE (PF) 100 MCG/2ML IJ SOLN
INTRAMUSCULAR | Status: DC | PRN
  Administered 2015-12-19: 50 ug via INTRAVENOUS
  Administered 2015-12-19: 25 ug via INTRAVENOUS
  Administered 2015-12-19: 50 ug via INTRAVENOUS
  Administered 2015-12-19: 25 ug via INTRAVENOUS

## 2015-12-19 MED ORDER — SENNA 8.6 MG PO TABS
1.0000 | ORAL_TABLET | Freq: Two times a day (BID) | ORAL | Status: DC
  Administered 2015-12-19 – 2015-12-22 (×7): 8.6 mg via ORAL
  Filled 2015-12-19 (×8): qty 1

## 2015-12-19 MED ORDER — 0.9 % SODIUM CHLORIDE (POUR BTL) OPTIME
TOPICAL | Status: DC | PRN
  Administered 2015-12-19: 1000 mL

## 2015-12-19 MED ORDER — PRAVASTATIN SODIUM 20 MG PO TABS
20.0000 mg | ORAL_TABLET | Freq: Every day | ORAL | Status: DC
  Administered 2015-12-20 – 2015-12-23 (×4): 20 mg via ORAL
  Filled 2015-12-19 (×4): qty 1

## 2015-12-19 MED ORDER — ENOXAPARIN SODIUM 40 MG/0.4ML ~~LOC~~ SOLN: 40.0000 mg | SUBCUTANEOUS | Status: DC

## 2015-12-19 MED ORDER — AMLODIPINE BESYLATE 5 MG PO TABS
5.0000 mg | ORAL_TABLET | Freq: Every day | ORAL | Status: DC
  Administered 2015-12-20 – 2015-12-23 (×4): 5 mg via ORAL
  Filled 2015-12-19 (×4): qty 1

## 2015-12-19 MED ORDER — FERROUS SULFATE 325 (65 FE) MG PO TABS
325.0000 mg | ORAL_TABLET | Freq: Three times a day (TID) | ORAL | Status: DC
  Administered 2015-12-20 – 2015-12-23 (×8): 325 mg via ORAL
  Filled 2015-12-19 (×7): qty 1

## 2015-12-19 MED ORDER — BUPIVACAINE HCL (PF) 0.5 % IJ SOLN
INTRAMUSCULAR | Status: AC
  Filled 2015-12-19: qty 30

## 2015-12-19 MED ORDER — BUPIVACAINE HCL (PF) 0.5 % IJ SOLN
INTRAMUSCULAR | Status: DC | PRN
  Administered 2015-12-19: 20 mL

## 2015-12-19 MED ORDER — BISACODYL 10 MG RE SUPP: 10.0000 mg | Freq: Every day | RECTAL | Status: DC | PRN

## 2015-12-19 MED ORDER — MAGNESIUM CITRATE PO SOLN: 1.0000 | Freq: Once | ORAL | Status: DC | PRN

## 2015-12-19 MED ORDER — HYDROCODONE-ACETAMINOPHEN 5-325 MG PO TABS
1.0000 | ORAL_TABLET | Freq: Four times a day (QID) | ORAL | Status: DC | PRN
  Administered 2015-12-19 – 2015-12-23 (×6): 1 via ORAL
  Administered 2015-12-23: 2 via ORAL
  Filled 2015-12-19: qty 2
  Filled 2015-12-19 (×6): qty 1

## 2015-12-19 MED ORDER — HYDROCODONE-ACETAMINOPHEN 5-325 MG PO TABS: 1.0000 | ORAL_TABLET | Freq: Four times a day (QID) | ORAL | Status: AC | PRN

## 2015-12-19 MED ORDER — ALUM & MAG HYDROXIDE-SIMETH 200-200-20 MG/5ML PO SUSP: 30.0000 mL | ORAL | Status: DC | PRN

## 2015-12-19 MED ORDER — RIVASTIGMINE TARTRATE 1.5 MG PO CAPS
6.0000 mg | ORAL_CAPSULE | Freq: Two times a day (BID) | ORAL | Status: DC
  Administered 2015-12-19 – 2015-12-23 (×7): 6 mg via ORAL
  Filled 2015-12-19: qty 4
  Filled 2015-12-19: qty 2
  Filled 2015-12-19: qty 4
  Filled 2015-12-19: qty 2
  Filled 2015-12-19 (×5): qty 4

## 2015-12-19 MED ORDER — SODIUM CHLORIDE 0.9 % IV SOLN
75.0000 mL/h | INTRAVENOUS | Status: DC
  Administered 2015-12-19 – 2015-12-20 (×2): 75 mL/h via INTRAVENOUS

## 2015-12-19 MED ORDER — VITAMIN B-12 1000 MCG PO TABS
1000.0000 ug | ORAL_TABLET | Freq: Every day | ORAL | Status: DC
  Administered 2015-12-20 – 2015-12-23 (×4): 1000 ug via ORAL
  Filled 2015-12-19 (×4): qty 1

## 2015-12-19 MED ORDER — ENOXAPARIN SODIUM 40 MG/0.4ML ~~LOC~~ SOLN
40.0000 mg | SUBCUTANEOUS | Status: DC
  Administered 2015-12-20 – 2015-12-23 (×4): 40 mg via SUBCUTANEOUS
  Filled 2015-12-19 (×3): qty 0.4

## 2015-12-19 SURGICAL SUPPLY — 61 items
BENZOIN TINCTURE PRP APPL 2/3 (GAUZE/BANDAGES/DRESSINGS) ×3 IMPLANT
BLADE SAW SGTL 18.5X63.X.64 HD (BLADE) ×3 IMPLANT
BRUSH FEMORAL CANAL (MISCELLANEOUS) IMPLANT
CAPT HIP HEMI 1 ×3 IMPLANT
CLOSURE STERI-STRIP 1/2X4 (GAUZE/BANDAGES/DRESSINGS) ×1
CLOSURE WOUND 1/2 X4 (GAUZE/BANDAGES/DRESSINGS) ×1
CLSR STERI-STRIP ANTIMIC 1/2X4 (GAUZE/BANDAGES/DRESSINGS) ×2 IMPLANT
COVER BACK TABLE 24X17X13 BIG (DRAPES) IMPLANT
COVER SURGICAL LIGHT HANDLE (MISCELLANEOUS) ×3 IMPLANT
DRAPE IMP U-DRAPE 54X76 (DRAPES) ×3 IMPLANT
DRAPE INCISE IOBAN 66X45 STRL (DRAPES) ×3 IMPLANT
DRAPE ORTHO SPLIT 77X108 STRL (DRAPES) ×4
DRAPE SURG ORHT 6 SPLT 77X108 (DRAPES) ×2 IMPLANT
DRAPE U-SHAPE 47X51 STRL (DRAPES) ×3 IMPLANT
DRILL BIT 5/64 (BIT) ×3 IMPLANT
DRSG MEPILEX BORDER 4X12 (GAUZE/BANDAGES/DRESSINGS) IMPLANT
DRSG MEPILEX BORDER 4X8 (GAUZE/BANDAGES/DRESSINGS) ×3 IMPLANT
DURAPREP 26ML APPLICATOR (WOUND CARE) ×6 IMPLANT
ELECT BLADE 6.5 EXT (BLADE) ×3 IMPLANT
ELECT CAUTERY BLADE 6.4 (BLADE) ×3 IMPLANT
ELECT REM PT RETURN 9FT ADLT (ELECTROSURGICAL) ×3
ELECTRODE REM PT RTRN 9FT ADLT (ELECTROSURGICAL) ×1 IMPLANT
FACESHIELD WRAPAROUND (MASK) ×6 IMPLANT
GLOVE BIOGEL PI ORTHO PRO SZ8 (GLOVE) ×4
GLOVE ORTHO TXT STRL SZ7.5 (GLOVE) ×6 IMPLANT
GLOVE PI ORTHO PRO STRL SZ8 (GLOVE) ×2 IMPLANT
GLOVE SURG ORTHO 8.0 STRL STRW (GLOVE) ×6 IMPLANT
GOWN STRL REUS W/ TWL XL LVL3 (GOWN DISPOSABLE) ×2 IMPLANT
GOWN STRL REUS W/TWL 2XL LVL3 (GOWN DISPOSABLE) ×3 IMPLANT
GOWN STRL REUS W/TWL XL LVL3 (GOWN DISPOSABLE) ×4
HANDPIECE INTERPULSE COAX TIP (DISPOSABLE)
KIT BASIN OR (CUSTOM PROCEDURE TRAY) ×3 IMPLANT
KIT ROOM TURNOVER OR (KITS) ×3 IMPLANT
MANIFOLD NEPTUNE II (INSTRUMENTS) ×3 IMPLANT
NDL SUT 6 .5 CRC .975X.05 MAYO (NEEDLE) IMPLANT
NEEDLE 22X1 1/2 (OR ONLY) (NEEDLE) ×3 IMPLANT
NEEDLE MAYO TAPER (NEEDLE)
NS IRRIG 1000ML POUR BTL (IV SOLUTION) ×3 IMPLANT
PACK TOTAL JOINT (CUSTOM PROCEDURE TRAY) ×3 IMPLANT
PACK UNIVERSAL I (CUSTOM PROCEDURE TRAY) ×3 IMPLANT
PAD ARMBOARD 7.5X6 YLW CONV (MISCELLANEOUS) ×9 IMPLANT
PILLOW ABDUCTION HIP (SOFTGOODS) ×3 IMPLANT
PRESSURIZER FEMORAL UNIV (MISCELLANEOUS) IMPLANT
RETRIEVER SUT HEWSON (MISCELLANEOUS) ×3 IMPLANT
SET HNDPC FAN SPRY TIP SCT (DISPOSABLE) IMPLANT
STEM SUMMIT BASIC PRESSFIT SZ5 (Hips) ×3 IMPLANT
STRIP CLOSURE SKIN 1/2X4 (GAUZE/BANDAGES/DRESSINGS) ×2 IMPLANT
SUT FIBERWIRE #2 38 REV NDL BL (SUTURE) ×9
SUT MNCRL AB 4-0 PS2 18 (SUTURE) ×3 IMPLANT
SUT VIC AB 0 CT1 27 (SUTURE) ×2
SUT VIC AB 0 CT1 27XBRD ANBCTR (SUTURE) ×1 IMPLANT
SUT VIC AB 1 CT1 27 (SUTURE) ×2
SUT VIC AB 1 CT1 27XBRD ANBCTR (SUTURE) ×1 IMPLANT
SUT VIC AB 3-0 SH 8-18 (SUTURE) ×3 IMPLANT
SUTURE FIBERWR#2 38 REV NDL BL (SUTURE) ×3 IMPLANT
SYR CONTROL 10ML LL (SYRINGE) ×3 IMPLANT
TOWEL OR 17X24 6PK STRL BLUE (TOWEL DISPOSABLE) ×3 IMPLANT
TOWEL OR 17X26 10 PK STRL BLUE (TOWEL DISPOSABLE) ×3 IMPLANT
TOWER CARTRIDGE SMART MIX (DISPOSABLE) IMPLANT
TRAY FOLEY CATH 16FRSI W/METER (SET/KITS/TRAYS/PACK) IMPLANT
WATER STERILE IRR 1000ML POUR (IV SOLUTION) ×12 IMPLANT

## 2015-12-19 NOTE — Anesthesia Postprocedure Evaluation (Signed)
Anesthesia Post Note  Patient: Kristen Nguyen  Procedure(s) Performed: Procedure(s) (LRB): RIGHT ARTHROPLASTY BIPOLAR HIP (HEMIARTHROPLASTY) (Right)  Patient location during evaluation: PACU Anesthesia Type: General Level of consciousness: awake and alert Pain management: pain level controlled Vital Signs Assessment: post-procedure vital signs reviewed and stable Respiratory status: spontaneous breathing, nonlabored ventilation, respiratory function stable and patient connected to nasal cannula oxygen Cardiovascular status: blood pressure returned to baseline and stable Postop Assessment: no signs of nausea or vomiting Anesthetic complications: no    Last Vitals:  Filed Vitals:   12/19/15 1431 12/19/15 1500  BP: 150/59 148/55  Pulse: 90 88  Temp: 36.6 C 36.7 C  Resp: 24 20    Last Pain: There were no vitals filed for this visit.               Germaine PomfretJACKSON,E. [place]

## 2015-12-19 NOTE — Consult Note (Signed)
  ORTHOPAEDIC CONSULTATION  REQUESTING PHYSICIAN: Alberteen Samhristopher P Danford, MD  Chief Complaint: right hip pain  HPI: Kristen Nguyen is a 82 y.o. female who complains of  Acute severe right hip pain after a mechanical fall yesterday.  Had a left femoral neck fracture that underwent hemiarthroplasty just a couple of weeks ago. She presents to [facility] and requested transfer to [facility]. She has advanced dementia and is not a very good historian, her daughter is at bedside. There was no other injuries according to the daughter. Movement makes it worse, resting makes it better.  Past Medical History  Diagnosis Date  . Alzheimer's disease   . Essential hypertension   . HLD (hyperlipidemia)   . Depression   . Fracture of femoral neck, right (HCC) 12/19/2015   Past Surgical History  Procedure Laterality Date  . Abdominal hysterectomy    . Hip arthroplasty Left 11/26/2015    Procedure: ARTHROPLASTY BIPOLAR HIP (HEMIARTHROPLASTY);  Surgeon: Teryl LucyJoshua Goldy Calandra, MD;  [facility];  Service: Orthopedics;  Laterality: Left;   Social History   Social History  . Marital Status: Married    Spouse Name: N/A  . Number of Children: N/A  . Years of Education: N/A   Social History Main Topics  . Smoking status: Never Smoker   . Smokeless tobacco: None  . Alcohol Use: No  . Drug Use: No  . Sexual Activity: Not Asked   Other Topics Concern  . None   Social History Narrative   History reviewed. No pertinent family history. No Known Allergies   Positive ROS: All other systems have been reviewed and were otherwise negative with the exception of those mentioned in the HPI and as above.  Physical Exam: General: She is awake, but confused, in mild distress Cardiovascular: No pedal edema Respiratory: No cyanosis, no use of accessory musculature GI: No organomegaly, abdomen is soft and non-tender Skin: No lesions in the area of chief complaint Neurologic: Sensation intact  distally Psychiatric: Patient is not competent for consent, but I spoke with her husband over the phone. Patient has advanced dementia and confusion at baseline. Lymphatic: No axillary or cervical lymphadenopathy  MUSCULOSKELETAL: Right hip has positive log roll and pain to palpation, EHL seems to be intact.  Assessment: Principal Problem:   Fracture of femoral neck, right (HCC)   She has multiple coexisting comorbidities as indicated above.  Plan: The risks benefits and alternatives were discussed with the patient and the family including but not limited to the risks of nonoperative treatment, versus surgical intervention including infection, bleeding, nerve injury, periprosthetic fracture, the need for revision surgery, dislocation, leg length discrepancy, blood clots, cardiopulmonary complications, morbidity, mortality, among others, and they were willing to proceed.    We will plan for a right hip hemiarthroplasty to treat the femoral neck fracture.     Eulas PostLANDAU,Tekoa Amon P, MD Cell [phone]   12/19/2015 11:02 AM

## 2015-12-19 NOTE — H&P (Signed)
History and Physical    Kristen Nguyen WUJ:[id number] DOB: August 22, 1933 DOA: 12/19/2015  PCP: Ernestine ConradBLUTH, KIRK, MD Patient coming from: rehab facility  Chief Complaint: hip fx right  HPI: Kristen RuskLois Nguyen is a 82 y.o. female with medical history significant for dementia, htn, hyperlipidemia recent left hip fx with surgical repair admitted for right hip fx due to mechanical fall at rehab facility.   Information is obtained from the chart patient is recovering from anesthesia and has dementia. Chart review indicates she was discharged from the hospital on 11/30/2015 2 rehabilitation facility after surgical repair of left hip fracture sustained due to mechanical fall. She went to the rehabilitation facility. Reportedly she sustained 2 falls there the second one resulting in right hip pain. [facility] and requested to be transferred [facility].    ED Course: [facility]  Review of Systems: As per HPI otherwise 10 point review of systems negative.   Ambulatory Status: Walker frequent falls  Past Medical History  Diagnosis Date  . Alzheimer's disease   . Essential hypertension   . HLD (hyperlipidemia)   . Depression   . Fracture of femoral neck, right (HCC) 12/19/2015  . Hip fx (HCC)   . Anemia     Past Surgical History  Procedure Laterality Date  . Abdominal hysterectomy    . Hip arthroplasty Left 11/26/2015    Procedure: ARTHROPLASTY BIPOLAR HIP (HEMIARTHROPLASTY);  Surgeon: Teryl LucyJoshua Landau, MD;  Location[facility];  Service: Orthopedics;  Laterality: Left;    Social History   Social History  . Marital Status: Married    Spouse Name: N/A  . Number of Children: N/A  . Years of Education: N/A   Occupational History  . Not on file.   Social History Main Topics  . Smoking status: Never Smoker   . Smokeless tobacco: Not on file  . Alcohol Use: No  . Drug Use: No  . Sexual Activity: Not on file   Other Topics Concern  . Not  on file   Social History Narrative    No Known Allergies  History reviewed. No pertinent family history.  Prior to Admission medications   Medication Sig Start Date End Date Taking? Authorizing Provider  amLODipine (NORVASC) 5 MG tablet Take 5 mg by mouth daily.    Historical Provider, MD  aspirin EC 81 MG tablet Take 81 mg by mouth daily.    Historical Provider, MD  baclofen (LIORESAL) 10 MG tablet Take 1 tablet (10 mg total) by mouth 3 (three) times daily. As needed for muscle spasm 12/19/15   Teryl LucyJoshua Landau, MD  cholecalciferol (VITAMIN D) 1000 units tablet Take 2,000 Units by mouth daily.    Historical Provider, MD  docusate sodium (COLACE) 100 MG capsule Take 100 mg by mouth daily.    Historical Provider, MD  enoxaparin (LOVENOX) 40 MG/0.4ML injection Inject 0.4 mLs (40 mg total) into the skin daily. 12/19/15   Teryl LucyJoshua Landau, MD  FLUoxetine (PROZAC) 20 MG tablet Take 20 mg by mouth daily.    Historical Provider, MD  HYDROcodone-acetaminophen (NORCO) 5-325 MG tablet Take 1-2 tablets by mouth every 6 (six) hours as needed for moderate pain. MAXIMUM TOTAL ACETAMINOPHEN DOSE IS 4000 MG PER DAY 12/19/15   Teryl LucyJoshua Landau, MD  magnesium gluconate (MAGONATE) 500 MG tablet Take 500 mg by mouth daily.    Historical Provider, MD  pravastatin (PRAVACHOL) 20 MG tablet Take 20 mg by mouth daily.  Historical Provider, MD  QUEtiapine (SEROQUEL) 25 MG tablet Take 0.5 tablets (12.5 mg total) by mouth at bedtime. 11/30/15   Costin Otelia Sergeant, MD  rivastigmine (EXELON) 6 MG capsule Take 6 mg by mouth 2 (two) times daily.     Historical Provider, MD  sennosides-docusate sodium (SENOKOT-S) 8.6-50 MG tablet Take 2 tablets by mouth daily. 11/26/15   Teryl Lucy, MD  vitamin B-12 (CYANOCOBALAMIN) 1000 MCG tablet Take 1,000 mcg by mouth daily.    Historical Provider, MD    Physical Exam: Filed Vitals:   12/19/15 0949 12/19/15 1402  BP: 138/76 147/62  Pulse: 83 93  Temp: 99 F (37.2 C)   TempSrc: Axillary     Resp: 16 22  SpO2: 98% 92%     General:  Appears Quite lethargic somewhat pale but not uncomfortable Eyes:  PERRL, EOMI, normal lids, iris ENT:  grossly normal hearing, lips & tongue, Neck:  no LAD, masses or thyromegaly Cardiovascular:  RRR, no m/r/g. Respiratory:  CTA bilaterally, somewhat shallow, no w/r/r. Normal respiratory effort. Abdomen:  soft, ntnd, positive bowel sounds but sluggish Skin:  no rash or induration seen on limited exam Musculoskeletal:  grossly normal tone BUE/BLE, good ROM, no bony abnormality Psychiatric:  grossly normal mood and affect, speech fluent and appropriate, AOx3 Neurologic:  Quite lethargic postoperatively. Responds to verbal stimuli. Attempts to follow commands.  Labs on Admission: I have personally reviewed following labs and imaging studies  CBC:  Recent Labs Lab 12/19/15 1007  WBC 13.7*  HGB 9.8*  HCT 32.2*  MCV 89.2  PLT 527*   Basic Metabolic Panel:  Recent Labs Lab 12/19/15 1007  NA 138  K 4.0  CL 108  CO2 22  GLUCOSE 114*  BUN 20  CREATININE 0.67  CALCIUM 8.7*   GFR: CrCl cannot be calculated (Unknown ideal weight.). Liver Function Tests: No results for input(s): AST, ALT, ALKPHOS, BILITOT, PROT, ALBUMIN in the last 168 hours. No results for input(s): LIPASE, AMYLASE in the last 168 hours. No results for input(s): AMMONIA in the last 168 hours. Coagulation Profile: No results for input(s): INR, PROTIME in the last 168 hours. Cardiac Enzymes: No results for input(s): CKTOTAL, CKMB, CKMBINDEX, TROPONINI in the last 168 hours. BNP (last 3 results) No results for input(s): PROBNP in the last 8760 hours. HbA1C: No results for input(s): HGBA1C in the last 72 hours. CBG: No results for input(s): GLUCAP in the last 168 hours. Lipid Profile: No results for input(s): CHOL, HDL, LDLCALC, TRIG, CHOLHDL, LDLDIRECT in the last 72 hours. Thyroid Function Tests: No results for input(s): TSH, T4TOTAL, FREET4, T3FREE,  THYROIDAB in the last 72 hours. Anemia Panel: No results for input(s): VITAMINB12, FOLATE, FERRITIN, TIBC, IRON, RETICCTPCT in the last 72 hours. Urine analysis: No results found for: COLORURINE, APPEARANCEUR, LABSPEC, PHURINE, GLUCOSEU, HGBUR, BILIRUBINUR, KETONESUR, PROTEINUR, UROBILINOGEN, NITRITE, LEUKOCYTESUR  Creatinine Clearance: CrCl cannot be calculated (Unknown ideal weight.).  Sepsis Labs: (procalcitonin:4,lacticidven:4) ) Recent Results (from the past 240 hour(s))  Surgical pcr screen     Status: None   Collection Time: 12/19/15 10:14 AM  Result Value Ref Range Status   MRSA, PCR NEGATIVE NEGATIVE Final   Staphylococcus aureus NEGATIVE NEGATIVE Final    Comment:        The Xpert SA Assay (FDA approved for NASAL specimens in patients over 18 years of age), is one component of a comprehensive surveillance program.  Test performance has been validated by [facility] for patients greater than or equal to 1  year old. It is not intended to diagnose infection nor to guide or monitor treatment.      Radiological Exams on Admission: No results found.  EKG: Independently reviewed Pending  Assessment/Plan Principal Problem:   Fracture of femoral neck, right (HCC) Active Problems:   Alzheimer's disease   Closed left hip fracture (HCC)   Essential hypertension   HLD (hyperlipidemia)   Anemia   Hip fx (HCC)   #1. Fracture of femoral neck on the right. Secondary to mechanical fall. Status post surgical repair -Admit -Management per orthopedics -Pain control -PT  #2. Post left hip fracture status post surgical repair. Discharge from hospital 11/30/2015. -Management per orthopedics  #3. Hypertension. Fair control in PACU. Home medications include amlodipine -Monitor -Continue amlodipine  #4. Anemia. Likely related to acute blood loss in setting of recent surgery and current surgery. Hemoglobin today 9.8. This is stable from surgery done 2 weeks ago.  Expect may trend downward given today surgery -Monitor -hold lovenox   #5. Leukocytosis. Likely do to stress. White count 13.1 on admission. Chart review indicates WBCs slightly greater than 10 on her last admission. -We'll obtain a urinalysis -gently hydration -repeat in am  6. Dementia. -continue home meds  #7. Hyperlipidemia. Recent lipid panel reveals triglycerides 202 LDL 104 HCl 35 -Continue home meds   DVT prophylaxis: scds  Code Status:  full Family Communication: none present  Disposition Plan: back to facility  Consults called: admitted after ortho surgery  Admission status: inpatient    Gwenyth BenderBLACK,Lyrick Worland M MD [facility]  If 7PM-7AM, please contact night-coverage [email or web address] Password [email or web address]  12/19/2015, 2:20 PM

## 2015-12-19 NOTE — Progress Notes (Signed)
Patient arrived from [facility] via transport.  OR made aware.

## 2015-12-19 NOTE — Anesthesia Procedure Notes (Addendum)
Procedure Name: Intubation Date/Time: 12/19/2015 11:43 AM Performed by: Bobbie StackANDERSON, Monicia Tse KIRSTEN Pre-anesthesia Checklist: Patient identified, Emergency Drugs available, Suction available, Patient being monitored and Timeout performed Patient Re-evaluated:Patient Re-evaluated prior to inductionOxygen Delivery Method: Circle system utilized Preoxygenation: Pre-oxygenation with 100% oxygen Intubation Type: IV induction Ventilation: Mask ventilation without difficulty Laryngoscope Size: 3 and Mac Grade View: Grade I Tube type: Oral Tube size: 7.5 mm Number of attempts: 1 Airway Equipment and Method: Stylet Placement Confirmation: ETT inserted through vocal cords under direct vision,  positive ETCO2 and breath sounds checked- equal and bilateral Secured at: 23 cm Tube secured with: Tape Dental Injury: Teeth and Oropharynx as per pre-operative assessment

## 2015-12-19 NOTE — Transfer of Care (Signed)
Immediate Anesthesia Transfer of Care Note  Patient: Kristen Nguyen  Procedure(s) Performed: Procedure(s): RIGHT ARTHROPLASTY BIPOLAR HIP (HEMIARTHROPLASTY) (Right)  Patient Location: PACU  Anesthesia Type:General  Level of Consciousness: drowsy, responds to stimulation  Airway & Oxygen Therapy: Patient Spontanous Breathing and Patient connected to nasal cannula oxygen  Post-op Assessment: Report given to RN and Post -op Vital signs reviewed and stable  Post vital signs: Reviewed and stable  Last Vitals:  Filed Vitals:   12/19/15 0949 12/19/15 1402  BP: 138/76 147/62  Pulse: 83 93  Temp: 37.2 C   Resp: 16 22    Last Pain: There were no vitals filed for this visit.       Complications: No apparent anesthesia complications

## 2015-12-19 NOTE — Progress Notes (Signed)
Patient attempting to pull out her foley catheter and IV.  Soft mittens applied to patient.  IV lines wrapped with gauze for protection.  Will continue to monitor.  Family at bedside (son-Michael and husband-Jerry).

## 2015-12-19 NOTE — Progress Notes (Signed)
Patient's son and husband concerned about her current level of agitation following surgery.  Son indicates last visit she had Ativan prn and it was helpful.  Contacted Dr. Dion SaucierLandau, [facility].  Will continue to monitor.

## 2015-12-19 NOTE — Anesthesia Preprocedure Evaluation (Addendum)
Anesthesia Evaluation  Patient identified by MRN, date of birth, ID band Patient awake    Reviewed: Allergy & Precautions, NPO status , Patient's Chart, lab work & pertinent test results  History of Anesthesia Complications Negative for: history of anesthetic complications  Airway Mallampati: II  TM Distance: >3 FB Neck ROM: Full    Dental  (+) Edentulous Upper, Edentulous Lower   Pulmonary neg pulmonary ROS,    breath sounds clear to auscultation       Cardiovascular hypertension, Pt. on medications (-) angina Rhythm:Regular Rate:Normal  11/29/15 ECHO: EF 60-65%, grade 1 diastolic dysfunction, mod TR with pulm HTN   Neuro/Psych PSYCHIATRIC DISORDERS (Alzheimer's) Anxiety Depression negative neurological ROS     GI/Hepatic negative GI ROS, Neg liver ROS,   Endo/Other  negative endocrine ROS  Renal/GU negative Renal ROS     Musculoskeletal   Abdominal   Peds  Hematology  (+) Blood dyscrasia (Hb 9.8), ,   Anesthesia Other Findings   Reproductive/Obstetrics                            Anesthesia Physical Anesthesia Plan  ASA: III  Anesthesia Plan: General   Post-op Pain Management:    Induction: Intravenous  Airway Management Planned: Oral ETT  Additional Equipment:   Intra-op Plan:   Post-operative Plan: Extubation in OR  Informed Consent: I have reviewed the patients History and Physical, chart, labs and discussed the procedure including the risks, benefits and alternatives for the proposed anesthesia with the patient or authorized representative who has indicated his/her understanding and acceptance.   Consent reviewed with POA  Plan Discussed with: CRNA and Surgeon  Anesthesia Plan Comments: (Plan routine monitors, GETA)        Anesthesia Quick Evaluation

## 2015-12-19 NOTE — Discharge Instructions (Signed)

## 2015-12-19 NOTE — Op Note (Signed)
12/19/2015  1:18 PM  PATIENT:  Kristen Nguyen   MRN: [id number]  PRE-OPERATIVE DIAGNOSIS:  Right displaced femoral neck fracture  POST-OPERATIVE DIAGNOSIS:  Same  PROCEDURE:  Procedure(s): RIGHT ARTHROPLASTY BIPOLAR HIP (HEMIARTHROPLASTY)  PREOPERATIVE INDICATIONS:  [name] is an 82 y.o. female who was admitted 12/19/2015 with a diagnosis of Fracture of femoral neck, right ([place]) and elected for surgical management.  The risks benefits and alternatives were discussed with the patient including but not limited to the risks of nonoperative treatment, versus surgical intervention including infection, bleeding, nerve injury, periprosthetic fracture, the need for revision surgery, dislocation, leg length discrepancy, blood clots, cardiopulmonary complications, morbidity, mortality, among others, and they were willing to proceed.  Predicted outcome is good, although there will be at least a six to nine month expected recovery.   OPERATIVE REPORT     SURGEON:  Marchia Bond, MD    ASSISTANT:  Joya Gaskins, OPA-C  (Present throughout the entire procedure,  necessary for completion of procedure in a timely manner, assisting with retraction, instrumentation, and closure)     ANESTHESIA:  General    Estimated blood loss: 762 mL  COMPLICATIONS:  None.     Unique aspects of the case: The hip was very tight during initial trialing, and I had to perform a second femoral neck resection with the saw in order to achieve appropriate soft tissue tensioning. After having done that, interestingly, the -3 was somewhat short, and I went to a 0 spacer.  COMPONENTS:  Chemical engineer Femoral Fracture stem size 5, with a 0 spacer and a size 48 fracture head unipolar hip ball.    PROCEDURE IN DETAIL: The patient was met in the holding area and identified.  The appropriate hip  was marked at the operative site. The patient was then transported to the OR and  placed under general anesthesia.  At that  point, the patient was  placed in the lateral decubitus position with the operative side up and  secured to the operating room table and all bony prominences padded.     The operative lower extremity was prepped from the iliac crest to the toes.  Sterile draping was performed.  Time out was performed prior to incision.      A routine posterolateral approach was utilized via sharp dissection  carried down to the subcutaneous tissue.  Gross bleeders were Bovie  coagulated.  The iliotibial band was identified and incised  along the length of the skin incision.  Self-retaining retractors were  inserted.  With the hip internally rotated, the short external rotators  were identified. The piriformis was tagged with FiberWire, and the hip capsule released in a T-type fashion.  The femoral neck was exposed, and I resected the femoral neck using the appropriate jig. This was performed at approximately a thumb's breadth above the lesser trochanter.    I then exposed the deep acetabulum, cleared out any tissue including the ligamentum teres, and included the hip capsule in the FiberWire used above and below the T.    I then prepared the proximal femur using the cookie-cutter, the lateralizing reamer, and then sequentially broached.  A trial utilized, and I reduced the hip and it was found to have excellent stability with functional range of motion. The trial components were then removed.   I then place the real implant and I impacted the real head ball into place. The hip was then reduced and taken through functional range of motion and found  to have excellent stability. Leg lengths were restored.  I then used a 2 mm drill bits to pass the FiberWire suture from the capsule and piriformis through the greater trochanter, and secured this. Excellent posterior capsular repair was achieved. I also closed the T in the capsule.  I then irrigated the hip copiously again with pulse lavage, and repaired the fascia with  Vicryl, followed by Vicryl for the subcutaneous tissue, Monocryl for the skin, Steri-Strips and sterile gauze. The wounds were injected. The patient was then awakened and returned to PACU in stable and satisfactory condition. There were no complications.  Marchia Bond, MD Orthopedic Surgeon [phone]   12/19/2015 1:18 PM

## 2015-12-20 ENCOUNTER — Encounter (HOSPITAL_COMMUNITY): Payer: Self-pay | Admitting: Orthopedic Surgery

## 2015-12-20 LAB — URINE MICROSCOPIC-ADD ON: RBC / HPF: NONE SEEN RBC/hpf (ref 0–5)

## 2015-12-20 LAB — URINALYSIS, ROUTINE W REFLEX MICROSCOPIC
Bilirubin Urine: NEGATIVE
Glucose, UA: NEGATIVE mg/dL
Hgb urine dipstick: NEGATIVE
KETONES UR: NEGATIVE mg/dL
LEUKOCYTES UA: NEGATIVE
NITRITE: NEGATIVE
PH: 5.5 (ref 5.0–8.0)
PROTEIN: 30 mg/dL — AB
SPECIFIC GRAVITY, URINE: 1.025 (ref 1.005–1.030)

## 2015-12-20 LAB — BASIC METABOLIC PANEL
Anion gap: 7 (ref 5–15)
BUN: 11 mg/dL (ref 6–20)
CHLORIDE: 106 mmol/L (ref 101–111)
CO2: 25 mmol/L (ref 22–32)
CREATININE: 0.74 mg/dL (ref 0.44–1.00)
Calcium: 8.4 mg/dL — ABNORMAL LOW (ref 8.9–10.3)
GFR calc non Af Amer: >60 mL/min (ref >60)
Glucose, Bld: 146 mg/dL — ABNORMAL HIGH (ref 65–99)
POTASSIUM: 4 mmol/L (ref 3.5–5.1)
Sodium: 138 mmol/L (ref 135–145)

## 2015-12-20 LAB — CBC
HEMATOCRIT: 27.9 % — AB (ref 36.0–46.0)
HEMOGLOBIN: 8.6 g/dL — AB (ref 12.0–15.0)
MCH: 27.7 pg (ref 26.0–34.0)
MCHC: 30.8 g/dL (ref 30.0–36.0)
MCV: 89.7 fL (ref 78.0–100.0)
Platelets: 431 10*3/uL — ABNORMAL HIGH (ref 150–400)
RBC: 3.11 MIL/uL — AB (ref 3.87–5.11)
RDW: 14.2 % (ref 11.5–15.5)
WBC: 11.8 10*3/uL — ABNORMAL HIGH (ref 4.0–10.5)

## 2015-12-20 MED FILL — Fentanyl Citrate Preservative Free (PF) Inj 100 MCG/2ML: INTRAMUSCULAR | Qty: 2 | Status: AC

## 2015-12-20 NOTE — Evaluation (Signed)
Occupational Therapy Evaluation Patient Details Name: Kristen RuskLois Hargan MRN: [id number] DOB: 07/12/1933 Today's Date: 12/20/2015    History of Present Illness Kristen Nguyen is a 82 y.o. female with a Past Medical History of dementia, htn, hld, sp recent left hip frx w/ surg repair, now w/ right hip frx due to mechanical fall at rehab.    Clinical Impression   Pt with decline in function and safety with ADLs and ADL mobility with decreased strength, balance, endurance and cognition. Pt would benefit from acute OT services to address impairments to increase level of function and safety. PTA, pt was at [facility] participating in rehab from L hip fracture    Follow Up Recommendations  SNF;Supervision/Assistance - 24 hour    Equipment Recommendations  Other (comment) (TBD at next venue of care)    Recommendations for Other Services       Precautions / Restrictions Precautions Precautions: Posterior Hip;Fall Precaution Comments: pt with dementia and unable to understand any hip precautions. has abducter wedge in place Restrictions Weight Bearing Restrictions: No RLE Weight Bearing: Weight bearing as tolerated LLE Weight Bearing: Weight bearing as tolerated      Mobility Bed Mobility Overal bed mobility: Needs Assistance;+2 for physical assistance Bed Mobility: Supine to Sit;Sit to Supine     Supine to sit: Max assist;+2 for physical assistance     General bed mobility comments: Pt in high degree of pain, max encouragement given. HOB elevated, used pads to scoot pt to EOB  Transfers Overall transfer level: Needs assistance   Transfers: Sit to/from Stand;Stand Pivot Transfers Sit to Stand: +2 physical assistance;From elevated surface;Mod assist Stand pivot transfers: Max assist;+2 physical assistance            Balance   Sitting-balance support: No upper extremity supported;Feet supported Sitting balance-Leahy Scale: Fair       Standing balance-Leahy Scale: Poor                               ADL   Eating/Feeding: Set up;Bed level;Supervision/ safety;Sitting   Grooming: Minimal assistance;Sitting;Wash/dry hands;Wash/dry face (mutlimodal cues to intiate)   Upper Body Bathing: Sitting;Maximal assistance (mutlimodal cues to intiate)   Lower Body Bathing: Total assistance   Upper Body Dressing : Maximal assistance (mutlimodal cues to intiate)   Lower Body Dressing: Total assistance                 General ADL Comments: pt pleasantly confused, limited by pain and not aware of deficits or current situation. Requires mutlimodal cues to intiate UB ADLs, grooming and self feeding     Vision  no change from baseline              Pertinent Vitals/Pain Pain Assessment: Faces Pain Score: 8  Pain Location: with bed mobility and SPT Pain Descriptors / Indicators: Grimacing;Guarding;Moaning Pain Intervention(s): Limited activity within patient's tolerance;Monitored during session;Repositioned     Hand Dominance Right   Extremity/Trunk Assessment Upper Extremity Assessment Upper Extremity Assessment: Generalized weakness   Lower Extremity Assessment Lower Extremity Assessment: Defer to PT evaluation       Communication Communication Communication: No difficulties   Cognition Arousal/Alertness: Awake/alert Behavior During Therapy: WFL for tasks assessed/performed Overall Cognitive Status: History of cognitive impairments - at baseline Area of Impairment: Orientation;Memory;Following commands;Safety/judgement     Memory: Decreased recall of precautions;Decreased short-term memory Following Commands: Follows one step commands inconsistently Safety/Judgement: Decreased awareness of safety;Decreased awareness of deficits  General Comments   pt pleasantly confused, cooperative and very sweet                 Home Living Family/patient expects to be discharged to:: Skilled nursing facility                                         Prior Functioning/Environment      ADL's / Homemaking Assistance Needed: pt previously at SNF for rehab due to fall with L hip fracture PTA and required extensive assist with ADLs. Prior to L hip fracture, pt's husband reports that she could bathe and dress herself        OT Diagnosis: Generalized weakness;Acute pain;Cognitive deficits   OT Problem List: Decreased activity tolerance;Impaired balance (sitting and/or standing);Decreased cognition;Decreased safety awareness;Decreased knowledge of use of DME or AE;Decreased knowledge of precautions;Pain   OT Treatment/Interventions: Self-care/ADL training;DME and/or AE instruction;Therapeutic activities;Patient/family education;Balance training    OT Goals(Current goals can be found in the care plan section) Acute Rehab OT Goals Patient Stated Goal: none stated OT Goal Formulation: Patient unable to participate in goal setting Time For Goal Achievement: 12/27/15 Potential to Achieve Goals: Good ADL Goals Pt Will Perform Grooming: with min guard assist;sitting (with verbal cues to initiate) Pt Will Perform Upper Body Bathing: with mod assist;sitting (with verbal cues to initiate) Pt Will Perform Lower Body Bathing: with mod assist;sitting/lateral leans;with caregiver independent in assisting;with max assist (with verbal cues to initiate) Pt Will Perform Upper Body Dressing: with mod assist;sitting (with verbal cues to initiate) Pt Will Transfer to Toilet: with mod assist;bedside commode;stand pivot transfer  OT Frequency: Min 2X/week   Barriers to D/C: Decreased caregiver support          Co-evaluation PT/OT/SLP Co-Evaluation/Treatment: Yes Reason for Co-Treatment: Necessary to address cognition/behavior during functional activity;For patient/therapist safety   OT goals addressed during session: ADL's and self-care      End of Session Equipment Utilized During Treatment: Gait belt Nurse  Communication: Mobility status  Activity Tolerance: Patient limited by pain Patient left: with call bell/phone within reach;with family/visitor present;in chair;with chair alarm set   Time: 7564-33291156-1220 OT Time Calculation (min): 24 min Charges:  OT General Charges $OT Visit: 1 Procedure OT Evaluation $OT Eval Moderate Complexity: 1 Procedure G-Codes:    Galen ManilaSpencer, Daphyne Miguez Jeanette 12/20/2015, 1:32 PM

## 2015-12-20 NOTE — Progress Notes (Signed)
Patient ID: Kristen RuskLois Nguyen, female   DOB: Apr 09, 1934, 82 y.o.   MRN: [id number]     Subjective:  Patient reports pain as mild to moderate.  Patient sleeping but does wake and follow directions.  Denies any CP or SOB.  Is not aware of time or place.  Objective:   VITALS:   Filed Vitals:   12/19/15 1431 12/19/15 1500 12/19/15 2012 12/20/15 0529  BP: 150/59 148/55 124/54 137/50  Pulse: 90 88 96 91  Temp: 97.9 F (36.6 C) 98 F (36.7 C) 98.6 F (37 C) 97.9 F (36.6 C)  TempSrc:   Oral   Resp: 24 20 19 18   SpO2: 96% 98% 93% 92%    ABD soft Sensation intact distally Dorsiflexion/Plantar flexion intact Incision: dressing C/D/I and scant drainage   Lab Results  Component Value Date   WBC 11.8* 12/20/2015   HGB 8.6* 12/20/2015   HCT 27.9* 12/20/2015   MCV 89.7 12/20/2015   PLT 431* 12/20/2015   BMET    Component Value Date/Time   NA 138 12/20/2015 0521   K 4.0 12/20/2015 0521   CL 106 12/20/2015 0521   CO2 25 12/20/2015 0521   GLUCOSE 146* 12/20/2015 0521   BUN 11 12/20/2015 0521   CREATININE 0.74 12/20/2015 0521   CALCIUM 8.4* 12/20/2015 0521   GFRNONAA >60 12/20/2015 0521   GFRAA >60 12/20/2015 0521     Assessment/Plan: 1 Day Post-Op   Principal Problem:   Fracture of femoral neck, right (HCC) Active Problems:   Alzheimer's disease   Closed left hip fracture (HCC)   Essential hypertension   HLD (hyperlipidemia)   Anemia   Hip fx (HCC)   Femoral neck fracture, right, closed, initial encounter   S/P ORIF (open reduction internal fixation) fracture   Advance diet Up with therapy  ABLA continue to monitor Continue plan per medicine WBAT Dry dressing PRN    Torrie MayersDOUGLAS PARRY, BRANDON 12/20/2015, 7:23 AM  Discussed and agree with above.   Teryl LucyJoshua Raymone Pembroke, MD Cell [phone]

## 2015-12-20 NOTE — Evaluation (Signed)
Physical Therapy Evaluation Patient Details Name: Kristen Nguyen MRN: [id number] DOB: [birth year] Today's Date: 12/20/2015   History of Present Illness  Pt is an 82 y/o female with a PMH of dementia, HTN, hyperlipidemia, recent L hip fracture with surgical repair 6/2. Pt returns from SNF with new R hip fx s/p fall x2 and is now s/p R hip hemiarthroplasty on 12/19/15. Pt is WBAT bilaterally and has active posterior hip precautions.  Clinical Impression  Pt admitted with above diagnosis. Pt currently with functional limitations due to the deficits listed below (see PT Problem List). At the time of PT eval pt was able to perform transfers with +2 assist for balance support and general safety. Pt states that she does not know why her leg hurts and requires significant cueing for reorientation to situation and goals of session. Pt will benefit from skilled PT to increase their independence and safety with mobility to allow discharge to the venue listed below.       Follow Up Recommendations SNF;Supervision/Assistance - 24 hour    Equipment Recommendations  None recommended by PT (TBD by next venue of care)    Recommendations for Other Services       Precautions / Restrictions Precautions Precautions: Posterior Hip;Fall Precaution Booklet Issued: Yes (comment) Precaution Comments: pt with dementia and unable to understand any hip precautions. has abducter wedge in place Restrictions Weight Bearing Restrictions: Yes RLE Weight Bearing: Weight bearing as tolerated LLE Weight Bearing: Weight bearing as tolerated      Mobility  Bed Mobility Overal bed mobility: Needs Assistance;+2 for physical assistance Bed Mobility: Supine to Sit;Sit to Supine     Supine to sit: Max assist;+2 for physical assistance     General bed mobility comments: Pt in high degree of pain, max encouragement given. HOB elevated, used pads to scoot pt to EOB  Transfers Overall transfer level: Needs  assistance Equipment used: 2 person hand held assist Transfers: Sit to/from Stand Sit to Stand: +2 physical assistance;From elevated surface;Mod assist Stand pivot transfers: Max assist;+2 physical assistance       General transfer comment: Bed height elevated. Pt was able to assist with power-up to full stand with +2 required for safe transition to recliner. Pain and decreased memory of surgery limiting transfers at this time.   Ambulation/Gait             General Gait Details: Unable at this time.   Stairs            Wheelchair Mobility    Modified Rankin (Stroke Patients Only)       Balance Overall balance assessment: History of Falls Sitting-balance support: Feet supported;No upper extremity supported Sitting balance-Leahy Scale: Poor Sitting balance - Comments: Hands-on assist required at all times to maintain sitting balance.    Standing balance support: Bilateral upper extremity supported;During functional activity Standing balance-Leahy Scale: Zero Standing balance comment: Requires +2 assist at this time.                              Pertinent Vitals/Pain Pain Assessment: Faces Pain Score: 8  Faces Pain Scale: Hurts whole lot Pain Location: With mobility Pain Descriptors / Indicators: Operative site guarding;Sore Pain Intervention(s): Limited activity within patient's tolerance;Monitored during session;Repositioned    Home Living Family/patient expects to be discharged to:: Skilled nursing facility  Prior Function        ADL's / Homemaking Assistance Needed: pt previously at SNF for rehab due to fall with L hip fracture PTA and required extensive assist with ADLs. Prior to L hip fracture, pt's husband reports that she could bathe and dress herself (taken from previous admission).        Hand Dominance   Dominant Hand: Right    Extremity/Trunk Assessment   Upper Extremity Assessment: Defer to OT  evaluation           Lower Extremity Assessment: Generalized weakness;RLE deficits/detail;LLE deficits/detail      Cervical / Trunk Assessment: Kyphotic  Communication   Communication: No difficulties (Speech slurred at times/difficult to understand)  Cognition Arousal/Alertness: Awake/alert Behavior During Therapy: WFL for tasks assessed/performed Overall Cognitive Status: History of cognitive impairments - at baseline Area of Impairment: Orientation;Memory;Following commands;Safety/judgement Orientation Level: Disoriented to;Place;Time;Situation   Memory: Decreased recall of precautions;Decreased short-term memory Following Commands: Follows one step commands inconsistently Safety/Judgement: Decreased awareness of safety;Decreased awareness of deficits          General Comments      Exercises        Assessment/Plan    PT Assessment Patient needs continued PT services  PT Diagnosis Difficulty walking;Acute pain   PT Problem List Decreased strength;Decreased range of motion;Decreased activity tolerance;Decreased balance;Decreased mobility;Decreased cognition;Decreased knowledge of use of DME;Decreased safety awareness;Decreased knowledge of precautions;Pain  PT Treatment Interventions DME instruction;Gait training;Functional mobility training;Therapeutic activities;Therapeutic exercise   PT Goals (Current goals can be found in the Care Plan section) Acute Rehab PT Goals Patient Stated Goal: none stated PT Goal Formulation: Patient unable to participate in goal setting Time For Goal Achievement: 01/03/16 Potential to Achieve Goals: Fair    Frequency Min 3X/week   Barriers to discharge        Co-evaluation   Reason for Co-Treatment: Necessary to address cognition/behavior during functional activity;For patient/therapist safety   OT goals addressed during session: ADL's and self-care       End of Session Equipment Utilized During Treatment: Gait  belt Activity Tolerance: Patient limited by pain Patient left: in chair;with chair alarm set;with call bell/phone within reach (Abduction wedge in place) Nurse Communication: Mobility status         Time: 5784-69621155-1219 PT Time Calculation (min) (ACUTE ONLY): 24 min   Charges:   PT Evaluation $PT Eval Moderate Complexity: 1 Procedure     PT G Codes:        Conni SlipperKirkman, Birdell Frasier 12/20/2015, 1:46 PM   Conni SlipperLaura Para Cossey, PT, DPT Acute Rehabilitation Services Pager: [phone]

## 2015-12-20 NOTE — Care Management Important Message (Signed)
Important Message  Patient Details  Name: Riki RuskLois Laday MRN: [id number] Date of Birth: May 17, 1934   Medicare Important Message Given:  Yes    Bernadette HoitShoffner, Ulas Zuercher Coleman 12/20/2015, 9:00 AM

## 2015-12-20 NOTE — Progress Notes (Signed)
PROGRESS NOTE                                                                                                                                                                                                             Patient Demographics:    Kristen Nguyen, is a 82 y.o. female, DOB - 14-Jun-1934, WUJ:[id number]  Admit date - 12/19/2015   Admitting Physician Alberteen Sam, MD  Outpatient Primary MD for the patient is Ernestine Conrad, MD  LOS - 1  No chief complaint on file.      Brief Narrative   82 y.o. female with a Past Medical History of dementia, htn, hld, sp recent left hip frx w/ surg repair, now w/ right hip frx due to mechanical fall at rehab, Status post hemiarthroplasty by Dr. Dion Saucier on 6/25   Subjective:    Kristen Nguyen today has, No headache, No chest pain, No abdominal pain .   Assessment  & Plan :    Principal Problem:   Fracture of femoral neck, right (HCC) Active Problems:   Alzheimer's disease   Closed left hip fracture (HCC)   Essential hypertension   HLD (hyperlipidemia)   Anemia   Hip fx (HCC)   Femoral neck fracture, right, closed, initial encounter   S/P ORIF (open reduction internal fixation) fracture  Fracture of femoral neck on the right.  - Secondary to mechanical fall. Status post surgical repair -Status post hemiarthroplasty by Dr. Dion Saucier on 6/25 - PT consult pending, but very likely will need SNF placement - Post left hip fracture status post surgical repair. Discharge from hospital 11/30/2015.  Hypertension - Continue amlodipine  Anemia - Likely related to acute blood loss in setting of recent surgery and current surgery.   Leukocytosis.  - Likely do to stress. Follow on urine analysis, trending down   Dementia. - continue home meds  Hyperlipidemia. - Continue home meds    Code Status : Full  Family Communication  : none at bedside  Disposition Plan  : pending PT, likely will need  SNF  Consults  :  orhto  Procedures  : Status post hemiarthroplasty by Dr. Dion Saucier on 6/25  DVT Prophylaxis  :  Lovenox   Lab Results  Component Value Date   PLT 431* 12/20/2015    Antibiotics  :    Anti-infectives  Start     Dose/Rate Route Frequency Ordered Stop   12/19/15 1800  ceFAZolin (ANCEF) IVPB 2g/100 mL premix     2 g 200 mL/hr over 30 Minutes Intravenous Every 6 hours 12/19/15 1504 12/20/15 0030   12/19/15 1130  ceFAZolin (ANCEF) IVPB 2g/100 mL premix     2 g 200 mL/hr over 30 Minutes Intravenous To ShortStay Procedural 12/19/15 1121 12/19/15 1215   12/19/15 1105  ceFAZolin (ANCEF) 2-4 GM/100ML-% IVPB    Comments:  Marrianne Mood   : cabinet override      12/19/15 1105 12/19/15 2314        Objective:   Filed Vitals:   12/19/15 1500 12/19/15 2012 12/20/15 0529 12/20/15 0950  BP: 148/55 124/54 137/50 141/52  Pulse: 88 96 91 86  Temp: 98 F (36.7 C) 98.6 F (37 C) 97.9 F (36.6 C)   TempSrc:  Oral    Resp: 20 19 18    SpO2: 98% 93% 92%     Wt Readings from Last 3 Encounters:  11/26/15 60.1 kg (132 lb 7.9 oz)     Intake/Output Summary (Last 24 hours) at 12/20/15 1252 Last data filed at 12/20/15 0830  Gross per 24 hour  Intake   1560 ml  Output    810 ml  Net    750 ml     Physical Exam  Awake Alert, Oriented X 3 Supple Neck,No JVD,  Symmetrical Chest wall movement, Good air movement bilaterally, CTAB RRR,No Gallops,Rubs or new Murmurs, No Parasternal Heave +ve B.Sounds, Abd Soft, No tenderness, No Cyanosis, Clubbing or edema, No new Rash or bruise     Data Review:    CBC  Recent Labs Lab 12/19/15 1007 12/20/15 0521  WBC 13.7* 11.8*  HGB 9.8* 8.6*  HCT 32.2* 27.9*  PLT 527* 431*  MCV 89.2 89.7  MCH 27.1 27.7  MCHC 30.4 30.8  RDW 14.0 14.2    Chemistries   Recent Labs Lab 12/19/15 1007 12/20/15 0521  NA 138 138  K 4.0 4.0  CL 108 106  CO2 22 25  GLUCOSE 114* 146*  BUN 20 11  CREATININE 0.67 0.74  CALCIUM 8.7*  8.4*   ------------------------------------------------------------------------------------------------------------------ No results for input(s): CHOL, HDL, LDLCALC, TRIG, CHOLHDL, LDLDIRECT in the last 72 hours.  No results found for: HGBA1C ------------------------------------------------------------------------------------------------------------------ No results for input(s): TSH, T4TOTAL, T3FREE, THYROIDAB in the last 72 hours.  Invalid input(s): FREET3 ------------------------------------------------------------------------------------------------------------------ No results for input(s): VITAMINB12, FOLATE, FERRITIN, TIBC, IRON, RETICCTPCT in the last 72 hours.  Coagulation profile No results for input(s): INR, PROTIME in the last 168 hours.  No results for input(s): DDIMER in the last 72 hours.  Cardiac Enzymes No results for input(s): CKMB, TROPONINI, MYOGLOBIN in the last 168 hours.  Invalid input(s): CK ------------------------------------------------------------------------------------------------------------------ No results found for: BNP  Inpatient Medications  Scheduled Meds: . amLODipine  5 mg Oral Daily  . baclofen  10 mg Oral TID  . cholecalciferol  2,000 Units Oral Daily  . docusate sodium  100 mg Oral BID  . enoxaparin (LOVENOX) injection  40 mg Subcutaneous Q24H  . ferrous sulfate  325 mg Oral TID PC  . FLUoxetine  20 mg Oral Daily  . magnesium gluconate  500 mg Oral Daily  . pravastatin  20 mg Oral Daily  . QUEtiapine  12.5 mg Oral QHS  . rivastigmine  6 mg Oral BID  . senna  1 tablet Oral BID  . vitamin B-12  1,000 mcg Oral Daily   Continuous  Infusions: . sodium chloride 75 mL/hr (12/20/15 0533)  . lactated ringers 50 mL/hr at 12/19/15 1057   PRN Meds:.acetaminophen **OR** acetaminophen, alum & mag hydroxide-simeth, bisacodyl, HYDROcodone-acetaminophen, LORazepam, magnesium citrate, menthol-cetylpyridinium **OR** phenol, morphine injection,  ondansetron **OR** ondansetron (ZOFRAN) IV, polyethylene glycol  Micro Results Recent Results (from the past 240 hour(s))  Surgical pcr screen     Status: None   Collection Time: 12/19/15 10:14 AM  Result Value Ref Range Status   MRSA, PCR NEGATIVE NEGATIVE Final   Staphylococcus aureus NEGATIVE NEGATIVE Final    Comment:        The Xpert SA Assay (FDA approved for NASAL specimens in patients over 80 years of age), is one component of a comprehensive surveillance program.  Test performance has been validated by [facility] for patients greater than or equal to 80 year old. It is not intended to diagnose infection nor to guide or monitor treatment.     Radiology Reports Dg Chest 1 View  11/25/2015  CLINICAL DATA:  Status post fall, with left hip deformity. Concern for chest injury. Initial encounter. EXAM: CHEST 1 VIEW COMPARISON:  None. FINDINGS: The lungs are mildly hyperexpanded. Minimal bilateral atelectasis or scarring is noted. There is no evidence of pleural effusion or pneumothorax. The cardiomediastinal silhouette is borderline normal. No acute osseous abnormalities are seen. IMPRESSION: Minimal bilateral atelectasis or scarring noted. Lungs otherwise clear. Electronically Signed   By: Roanna RaiderJeffery  Chang M.D.   On: 11/25/2015 23:09   Ct Head Wo Contrast  11/26/2015  CLINICAL DATA:  Altered mental status.  Confusion and combative. EXAM: CT HEAD WITHOUT CONTRAST TECHNIQUE: Contiguous axial images were obtained from the base of the skull through the vertex without intravenous contrast. COMPARISON:  None. FINDINGS: Brain: Generalized cerebral atrophy, mild- moderate chronic small vessel ischemia.No intracranial hemorrhage, mass effect, or midline shift. No hydrocephalus. The basilar cisterns are patent. No evidence of territorial infarct. No intracranial fluid collection. Vascular: No hyperdense vessel or abnormal calcification. Atherosclerosis of skullbase vasculature. Skull:  Calvarium  is intact. Sinuses/Orbits: Minimal mucosal thickening of the inferior right maxillary sinus. Paranasal sinuses are otherwise clear. Mastoid air cells are well aerated. Other: None. IMPRESSION: Atrophy and chronic small vessel ischemia, no acute intracranial abnormality. Electronically Signed   By: Rubye OaksMelanie  Ehinger M.D.   On: 11/26/2015 03:45   Pelvis Portable  11/26/2015  CLINICAL DATA:  Bipolar left hip arthroplasty EXAM: PORTABLE PELVIS 1-2 VIEWS COMPARISON:  None. FINDINGS: Single view shows bipolar hip arthroplasty on the left. Components appear well positioned. No radiographically detectable complication. IMPRESSION: Good appearance following left hip arthroplasty. Electronically Signed   By: Paulina FusiMark  Shogry M.D.   On: 11/26/2015 10:34   Dg Hip Port Unilat With Pelvis 1v Right  12/19/2015  CLINICAL DATA:  Status post internal fixation of a right hip fracture. EXAM: DG HIP (WITH OR WITHOUT PELVIS) 1V PORT RIGHT COMPARISON:  None. FINDINGS: There are bilateral hip prostheses, both appearing intact and appropriately positioned. Expected postsurgical changes are seen within the soft tissues about the right hip. No evidence of surgical complicating feature. IMPRESSION: Postsurgical changes as above. Electronically Signed   By: Bary RichardStan  Maynard M.D.   On: 12/19/2015 14:41   Dg Hip Unilat With Pelvis 2-3 Views Left  11/25/2015  CLINICAL DATA:  Status post unwitnessed fall, with left hip pain. Initial encounter. EXAM: DG HIP (WITH OR WITHOUT PELVIS) 2-3V LEFT COMPARISON:  None. FINDINGS: There is a mildly displaced subcapital fracture through the left femoral neck. The left femoral  head remains seated at the acetabulum. The right hip joint is grossly unremarkable. No significant degenerative change is appreciated. The sacroiliac joints are unremarkable in appearance. The visualized bowel gas pattern is grossly unremarkable in appearance. IMPRESSION: Mildly displaced subcapital fracture through the left femoral neck.  Electronically Signed   By: Roanna RaiderJeffery  Chang M.D.   On: 11/25/2015 23:08    Time Spent in minutes  25 minutes   Jacaria Colburn M.D on 12/20/2015 at 12:52 PM  Between 7am to 7pm - Pager - [phone]  After 7pm go to [email or web address] - password [email or web address]  [facility] -  Office  [phone]

## 2015-12-20 NOTE — Clinical Social Work Note (Signed)
Clinical Social Work Assessment  Patient Details  Name: Kristen Nguyen MRN: [id number] Date of Birth: October 19, 1933  Date of referral:  12/20/15               Reason for consult:  Facility Placement, Discharge Planning                Permission sought to share information with:  Family Supports, Customer service manager Permission granted to share information::  Yes, Verbal Permission Granted (husband gave verbal permission to contact SNF)  Name::      Sonia Side, spouse [phone])  Agency::   [facility] SN[facility])  Relationship::     Contact Information:     Housing/Transportation Living arrangements for the past 2 months:  Single Family Home, [place] of Information:  Spouse Patient Interpreter Needed:  None Criminal Activity/Legal Involvement Pertinent to Current Situation/Hospitalization:  No - Comment as needed Significant Relationships:  Spouse Lives with:    Do you feel safe going back to the place where you live?  Yes Need for family participation in patient care:  Yes (Comment) (patient has severe dementia)  Care giving concerns:  Husband is concerned about patient's discharged medications as they were not correct during the last admission/discharge.   Social Worker assessment / plan:  CSW met with patient and husband to review disposition.  Patient has severe dementia and patient's husband is main caregiver.  Husband states she is from [facility] and has plans for her to return at time of discharge.  Husband has met with administration at [facility] to formulate plan in regards to safety once patient's hip heals enough for her to be mobile.  Of note, patient will need PTAR transportation.  Employment status:  Retired Forensic scientist:  [employer]) PT Recommendations:  Not assessed at this time Information / Referral to community resources:  [place]  Patient/Family's Response to care:   Husband is agreeable to patient's return to SNF.  Patient has severe dementia.  Patient/Family's Understanding of and Emotional Response to Diagnosis, Current Treatment, and Prognosis:  Patient's husband expressed wishes that he could care for wife at home and expressed guilt in regards to her new falls at the SNF stating he wishes he would have been able to watch her 24/7 there and maybe she would not have fallen again.  CSW provided support and husband has contacted SNF in regards to new safety plan once patient returns.  Emotional Assessment Appearance:  Appears stated age Attitude/Demeanor/Rapport:  Lethargic Affect (typically observed):  Restless (patient in mittens) Orientation:  Oriented to Self Alcohol / Substance use:  Not Applicable Psych involvement (Current and /or in the community):  No (Comment)  Discharge Needs  Concerns to be addressed:  Discharge Planning Concerns Readmission within the last 30 days:  Yes (discharged on 6/6 to [facility] for left hip) Current discharge risk:  Physical Impairment, Cognitively Impaired, Chronically ill Barriers to Discharge:  Continued Medical Work up, [place], [place], LCSW 12/20/2015, 12:41 PM

## 2015-12-21 ENCOUNTER — Inpatient Hospital Stay (HOSPITAL_COMMUNITY): Payer: PPO

## 2015-12-21 DIAGNOSIS — A419 Sepsis, unspecified organism: Secondary | ICD-10-CM

## 2015-12-21 LAB — BASIC METABOLIC PANEL
ANION GAP: 5 (ref 5–15)
BUN: 11 mg/dL (ref 6–20)
CALCIUM: 8.3 mg/dL — AB (ref 8.9–10.3)
CO2: 25 mmol/L (ref 22–32)
CREATININE: 0.7 mg/dL (ref 0.44–1.00)
Chloride: 107 mmol/L (ref 101–111)
GFR calc Af Amer: >60 mL/min (ref >60)
GLUCOSE: 130 mg/dL — AB (ref 65–99)
Potassium: 3.6 mmol/L (ref 3.5–5.1)
Sodium: 137 mmol/L (ref 135–145)

## 2015-12-21 LAB — CBC
HEMATOCRIT: 25.5 % — AB (ref 36.0–46.0)
Hemoglobin: 7.9 g/dL — ABNORMAL LOW (ref 12.0–15.0)
MCH: 27.5 pg (ref 26.0–34.0)
MCHC: 31 g/dL (ref 30.0–36.0)
MCV: 88.9 fL (ref 78.0–100.0)
PLATELETS: 371 10*3/uL (ref 150–400)
RBC: 2.87 MIL/uL — ABNORMAL LOW (ref 3.87–5.11)
RDW: 14.1 % (ref 11.5–15.5)
WBC: 13.4 10*3/uL — AB (ref 4.0–10.5)

## 2015-12-21 LAB — PREPARE RBC (CROSSMATCH)

## 2015-12-21 MED ORDER — DEXTROSE 5 % IV SOLN
1.0000 g | INTRAVENOUS | Status: DC
  Administered 2015-12-21 – 2015-12-23 (×3): 1 g via INTRAVENOUS
  Filled 2015-12-21 (×3): qty 10

## 2015-12-21 MED ORDER — SODIUM CHLORIDE 0.9 % IV SOLN: Freq: Once | INTRAVENOUS | Status: DC

## 2015-12-21 MED ORDER — SODIUM CHLORIDE 0.9 % IV SOLN
INTRAVENOUS | Status: DC
  Administered 2015-12-21: 13:00:00 via INTRAVENOUS

## 2015-12-21 NOTE — Progress Notes (Signed)
     Subjective:  Patient reports pain as mild.  "I'm doing great!"  Objective:   VITALS:   Filed Vitals:   12/20/15 0950 12/20/15 1500 12/20/15 2245 12/21/15 0534  BP: 141/52 124/73 154/54 144/47  Pulse: 86 89 90   Temp:  99.9 F (37.7 C)  99.3 F (37.4 C)  TempSrc:  Axillary    Resp:  17 19 18   SpO2:  95% 93% 92%    Neurologically intact Dorsiflexion/Plantar flexion intact Incision: dressing C/D/I and scant drainage   Lab Results  Component Value Date   WBC 13.4* 12/21/2015   HGB 7.9* 12/21/2015   HCT 25.5* 12/21/2015   MCV 88.9 12/21/2015   PLT 371 12/21/2015   BMET    Component Value Date/Time   NA 137 12/21/2015 0516   K 3.6 12/21/2015 0516   CL 107 12/21/2015 0516   CO2 25 12/21/2015 0516   GLUCOSE 130* 12/21/2015 0516   BUN 11 12/21/2015 0516   CREATININE 0.70 12/21/2015 0516   CALCIUM 8.3* 12/21/2015 0516   GFRNONAA >60 12/21/2015 0516   GFRAA >60 12/21/2015 0516     Assessment/Plan: 2 Days Post-Op   Principal Problem:   Fracture of femoral neck, right (HCC) Active Problems:   Alzheimer's disease   Closed left hip fracture (HCC)   Essential hypertension   HLD (hyperlipidemia)   Anemia   Hip fx (HCC)   Femoral neck fracture, right, closed, initial encounter   S/P ORIF (open reduction internal fixation) fracture   Advance diet Up with therapy Discharge to SNF ABLA: observe unless TRH feels transfusion is indicated.     Kristen Nguyen P 12/21/2015, 10:01 AM   Kristen LucyJoshua Brandye Inthavong, MD Cell [phone]

## 2015-12-21 NOTE — Clinical Social Work Note (Signed)
Patient will discharge back to [facility] (SNF) once medically stable.  Vickii PennaGina Jethro Radke, LCSW [phone]  [address] [facility] Licensed Clinical Social Worker

## 2015-12-21 NOTE — Progress Notes (Addendum)
PROGRESS NOTE                                                                                                                                                                                                             Patient Demographics:    Kristen RuskLois Nguyen, is a 82 y.o. female, DOB - [birth year], WJX:[id number]  Admit date - 12/19/2015   Admitting Physician Alberteen Samhristopher P Danford, MD  Outpatient Primary MD for the patient is Ernestine ConradBLUTH, KIRK, MD  LOS - 2  No chief complaint on file.      Brief Narrative   82 y.o. female with a Past Medical History of dementia, htn, hld, sp recent left hip frx w/ surg repair, now w/ right hip frx due to mechanical fall at rehab, Status post hemiarthroplasty by Dr. Dion SaucierLandau on 6/25, Patient with significant fever on 6/27, [facility] culture [facility] was sent to us today showing more than 100,000 colonies of GNR.   Subjective:    Kristen RuskLois Bankhead today has, No headache, No chest pain, No abdominal pain , Patient with fever this a.m..   Assessment  & Plan :    Principal Problem:   Fracture of femoral neck, right (HCC) Active Problems:   Alzheimer's disease   Closed left hip fracture (HCC)   Essential hypertension   HLD (hyperlipidemia)   Anemia   Hip fx (HCC)   Femoral neck fracture, right, closed, initial encounter   S/P ORIF (open reduction internal fixation) fracture  Fracture of femoral neck on the right.  - Secondary to mechanical fall. Status post surgical repair -Status post hemiarthroplasty by Dr. Dion SaucierLandau on 6/25 - will need SNF placement - Post left hip fracture status post surgical repair. Discharge from hospital 11/30/2015.  Sepsis  - Patient with significant fever over 102, significant elevation of leukocytosis at 13 K, tachycardic, blood cultures were sent, chest x-ray pending - Patient had urine culture done at [facility] on 6/25 and growing Citrobacter koseri, pansensitive, will  start on IV Rocephin, urine culture was done 6/25, faxed to us on 6/27.  Hypertension - Continue amlodipine  Anemia - Likely related to acute blood loss in setting of recent surgery and current surgery.  - hemoglobin is 7.9 today, will transfuse 1 unit PRBC.  Dementia. - continue home meds  Hyperlipidemia. - Continue home meds  Code Status : Full  Family Communication  : none at bedside  Disposition Plan  : will need SNF, awaiting resolution of sepsis, and final results on urine culture.  Consults  :  orhto  Procedures  : Status post hemiarthroplasty by Dr. Dion Saucier on 6/25  DVT Prophylaxis  :  Lovenox   Lab Results  Component Value Date   PLT 371 12/21/2015    Antibiotics  :    Anti-infectives    Start     Dose/Rate Route Frequency Ordered Stop   12/19/15 1800  ceFAZolin (ANCEF) IVPB 2g/100 mL premix     2 g 200 mL/hr over 30 Minutes Intravenous Every 6 hours 12/19/15 1504 12/20/15 0030   12/19/15 1130  ceFAZolin (ANCEF) IVPB 2g/100 mL premix     2 g 200 mL/hr over 30 Minutes Intravenous To ShortStay Procedural 12/19/15 1121 12/19/15 1215   12/19/15 1105  ceFAZolin (ANCEF) 2-4 GM/100ML-% IVPB    Comments:  Marrianne Mood   : cabinet override      12/19/15 1105 12/19/15 2314        Objective:   Filed Vitals:   12/20/15 2245 12/21/15 0534 12/21/15 1056 12/21/15 1132  BP: 154/54 144/47 136/53   Pulse: 90  101   Temp:  99.3 F (37.4 C) 101.3 F (38.5 C) 102 F (38.9 C)  TempSrc:   Oral   Resp: SpO2: 93% 92% 94%     Wt Readings from Last 3 Encounters:  11/26/15 60.1 kg (132 lb 7.9 oz)     Intake/Output Summary (Last 24 hours) at 12/21/15 1217 Last data filed at 12/21/15 0830  Gross per 24 hour  Intake    730 ml  Output      0 ml  Net    730 ml     Physical Exam  Awake Alert, Oriented X 3 Supple Neck,No JVD,  Symmetrical Chest wall movement, Good air movement bilaterally, CTAB RRR,No Gallops,Rubs or new Murmurs, No Parasternal  Heave +ve B.Sounds, Abd Soft, No tenderness, No Cyanosis, Clubbing or edema, No new Rash or bruise     Data Review:    CBC  Recent Labs Lab 12/19/15 1007 12/20/15 0521 12/21/15 0516  WBC 13.7* 11.8* 13.4*  HGB 9.8* 8.6* 7.9*  HCT 32.2* 27.9* 25.5*  PLT 527* 431* 371  MCV 89.2 89.7 88.9  MCH 27.1 27.7 27.5  MCHC 30.4 30.8 31.0  RDW 14.0 14.2 14.1    Chemistries   Recent Labs Lab 12/19/15 1007 12/20/15 0521 12/21/15 0516  NA 138 138 137  K 4.0 4.0 3.6  CL 108 106 107  CO2 GLUCOSE 114* 146* 130*  BUN CREATININE 0.67 0.74 0.70  CALCIUM 8.7* 8.4* 8.3*   ------------------------------------------------------------------------------------------------------------------ No results for input(s): CHOL, HDL, LDLCALC, TRIG, CHOLHDL, LDLDIRECT in the last 72 hours.  No results found for: HGBA1C ------------------------------------------------------------------------------------------------------------------ No results for input(s): TSH, T4TOTAL, T3FREE, THYROIDAB in the last 72 hours.  Invalid input(s): FREET3 ------------------------------------------------------------------------------------------------------------------ No results for input(s): VITAMINB12, FOLATE, FERRITIN, TIBC, IRON, RETICCTPCT in the last 72 hours.  Coagulation profile No results for input(s): INR, PROTIME in the last 168 hours.  No results for input(s): DDIMER in the last 72 hours.  Cardiac Enzymes No results for input(s): CKMB, TROPONINI, MYOGLOBIN in the last 168 hours.  Invalid input(s): CK ------------------------------------------------------------------------------------------------------------------ No results found for: BNP  Inpatient Medications  Scheduled Meds: . amLODipine  5 mg Oral Daily  .  baclofen  10 mg Oral TID  . cholecalciferol  2,000 Units Oral Daily  . docusate sodium  100 mg Oral BID  . enoxaparin (LOVENOX) injection  40 mg Subcutaneous Q24H  .  ferrous sulfate  325 mg Oral TID PC  . FLUoxetine  20 mg Oral Daily  . magnesium gluconate  500 mg Oral Daily  . pravastatin  20 mg Oral Daily  . QUEtiapine  12.5 mg Oral QHS  . rivastigmine  6 mg Oral BID  . senna  1 tablet Oral BID  . vitamin B-12  1,000 mcg Oral Daily   Continuous Infusions: . sodium chloride    . lactated ringers 50 mL/hr at 12/19/15 1057   PRN Meds:.acetaminophen **OR** acetaminophen, alum & mag hydroxide-simeth, bisacodyl, HYDROcodone-acetaminophen, LORazepam, magnesium citrate, menthol-cetylpyridinium **OR** phenol, morphine injection, ondansetron **OR** ondansetron (ZOFRAN) IV, polyethylene glycol  Micro Results Recent Results (from the past 240 hour(s))  Surgical pcr screen     Status: None   Collection Time: 12/19/15 10:14 AM  Result Value Ref Range Status   MRSA, PCR NEGATIVE NEGATIVE Final   Staphylococcus aureus NEGATIVE NEGATIVE Final    Comment:        The Xpert SA Assay (FDA approved for NASAL specimens in patients over 46 years of age), is one component of a comprehensive surveillance program.  Test performance has been validated by [facility] for patients greater than or equal to 1 year old. It is not intended to diagnose infection nor to guide or monitor treatment.     Radiology Reports Dg Chest 1 View  11/25/2015  CLINICAL DATA:  Status post fall, with left hip deformity. Concern for chest injury. Initial encounter. EXAM: CHEST 1 VIEW COMPARISON:  None. FINDINGS: The lungs are mildly hyperexpanded. Minimal bilateral atelectasis or scarring is noted. There is no evidence of pleural effusion or pneumothorax. The cardiomediastinal silhouette is borderline normal. No acute osseous abnormalities are seen. IMPRESSION: Minimal bilateral atelectasis or scarring noted. Lungs otherwise clear. Electronically Signed   By: Roanna Raider M.D.   On: 11/25/2015 23:09   Ct Head Wo Contrast  11/26/2015  CLINICAL DATA:  Altered mental status.  Confusion  and combative. EXAM: CT HEAD WITHOUT CONTRAST TECHNIQUE: Contiguous axial images were obtained from the base of the skull through the vertex without intravenous contrast. COMPARISON:  None. FINDINGS: Brain: Generalized cerebral atrophy, mild- moderate chronic small vessel ischemia.No intracranial hemorrhage, mass effect, or midline shift. No hydrocephalus. The basilar cisterns are patent. No evidence of territorial infarct. No intracranial fluid collection. Vascular: No hyperdense vessel or abnormal calcification. Atherosclerosis of skullbase vasculature. Skull:  Calvarium is intact. Sinuses/Orbits: Minimal mucosal thickening of the inferior right maxillary sinus. Paranasal sinuses are otherwise clear. Mastoid air cells are well aerated. Other: None. IMPRESSION: Atrophy and chronic small vessel ischemia, no acute intracranial abnormality. Electronically Signed   By: Rubye Oaks M.D.   On: 11/26/2015 03:45   Pelvis Portable  11/26/2015  CLINICAL DATA:  Bipolar left hip arthroplasty EXAM: PORTABLE PELVIS 1-2 VIEWS COMPARISON:  None. FINDINGS: Single view shows bipolar hip arthroplasty on the left. Components appear well positioned. No radiographically detectable complication. IMPRESSION: Good appearance following left hip arthroplasty. Electronically Signed   By: Paulina Fusi M.D.   On: 11/26/2015 10:34   Dg Hip Port Unilat With Pelvis 1v Right  12/19/2015  CLINICAL DATA:  Status post internal fixation of a right hip fracture. EXAM: DG HIP (WITH OR WITHOUT PELVIS) 1V PORT RIGHT COMPARISON:  None. FINDINGS: There  are bilateral hip prostheses, both appearing intact and appropriately positioned. Expected postsurgical changes are seen within the soft tissues about the right hip. No evidence of surgical complicating feature. IMPRESSION: Postsurgical changes as above. Electronically Signed   By: Bary RichardStan  Maynard M.D.   On: 12/19/2015 14:41   Dg Hip Unilat With Pelvis 2-3 Views Left  11/25/2015  CLINICAL DATA:   Status post unwitnessed fall, with left hip pain. Initial encounter. EXAM: DG HIP (WITH OR WITHOUT PELVIS) 2-3V LEFT COMPARISON:  None. FINDINGS: There is a mildly displaced subcapital fracture through the left femoral neck. The left femoral head remains seated at the acetabulum. The right hip joint is grossly unremarkable. No significant degenerative change is appreciated. The sacroiliac joints are unremarkable in appearance. The visualized bowel gas pattern is grossly unremarkable in appearance. IMPRESSION: Mildly displaced subcapital fracture through the left femoral neck. Electronically Signed   By: Roanna RaiderJeffery  Chang M.D.   On: 11/25/2015 23:08    Time Spent in minutes  25 minutes   Yaphet Smethurst M.D on 12/21/2015 at 12:17 PM  Between 7am to 7pm - Pager - [phone]  After 7pm go to [email or web address] - password [email or web address]  [facility] -  Office  [phone]

## 2015-12-22 DIAGNOSIS — I10 Essential (primary) hypertension: Secondary | ICD-10-CM

## 2015-12-22 DIAGNOSIS — D649 Anemia, unspecified: Secondary | ICD-10-CM

## 2015-12-22 DIAGNOSIS — E876 Hypokalemia: Secondary | ICD-10-CM

## 2015-12-22 DIAGNOSIS — S72001A Fracture of unspecified part of neck of right femur, initial encounter for closed fracture: Principal | ICD-10-CM

## 2015-12-22 DIAGNOSIS — N390 Urinary tract infection, site not specified: Secondary | ICD-10-CM

## 2015-12-22 LAB — CBC
HEMATOCRIT: 27.5 % — AB (ref 36.0–46.0)
Hemoglobin: 8.9 g/dL — ABNORMAL LOW (ref 12.0–15.0)
MCH: 28.6 pg (ref 26.0–34.0)
MCHC: 32.4 g/dL (ref 30.0–36.0)
MCV: 88.4 fL (ref 78.0–100.0)
Platelets: 323 10*3/uL (ref 150–400)
RBC: 3.11 MIL/uL — ABNORMAL LOW (ref 3.87–5.11)
RDW: 14 % (ref 11.5–15.5)
WBC: 11.6 10*3/uL — AB (ref 4.0–10.5)

## 2015-12-22 LAB — BASIC METABOLIC PANEL
ANION GAP: 8 (ref 5–15)
BUN: 12 mg/dL (ref 6–20)
CALCIUM: 8.1 mg/dL — AB (ref 8.9–10.3)
CO2: 22 mmol/L (ref 22–32)
Chloride: 110 mmol/L (ref 101–111)
Creatinine, Ser: 0.54 mg/dL (ref 0.44–1.00)
GFR calc Af Amer: >60 mL/min (ref >60)
GFR calc non Af Amer: >60 mL/min (ref >60)
GLUCOSE: 121 mg/dL — AB (ref 65–99)
POTASSIUM: 3.3 mmol/L — AB (ref 3.5–5.1)
Sodium: 140 mmol/L (ref 135–145)

## 2015-12-22 MED ORDER — POTASSIUM CHLORIDE CRYS ER 20 MEQ PO TBCR
40.0000 meq | EXTENDED_RELEASE_TABLET | Freq: Once | ORAL | Status: DC
Start: 2015-12-22 — End: 2015-12-23
  Filled 2015-12-22 (×2): qty 2

## 2015-12-22 NOTE — Progress Notes (Signed)
Patient did not void on this shift. MD notified. Bladder scan done 600cc in the bladder. In & out cath done 600cc removed from the bladder.

## 2015-12-22 NOTE — Progress Notes (Signed)
PROGRESS NOTE                                                                                                                                                                                                             Patient Demographics:    Kristen Nguyen, is a 82 y.o. female, DOB - 03/07/1934, ZOX:[id number]  Admit date - 12/19/2015   Admitting Physician Alberteen Sam, MD  Outpatient Primary MD for the patient is Ernestine Conrad, MD  LOS - 3  No chief complaint on file.      Brief Narrative   82 y.o. female with a Past Medical History of dementia, htn, hld, sp recent left hip frx w/ surg repair, now w/ right hip frx due to mechanical fall at rehab, Status post hemiarthroplasty by Dr. Dion Saucier on 6/25, Patient with significant fever on 6/27, urine culture results from [facility] was sent to [place] today showing more than 100,000 colonies of GNR.   Subjective:    Fever is resolving. No new complaints. Seen alongside patient's husband. Also discussed with the Orthopedic team and Nursing staff. Urine culture from the outside facility grew citrobacter. H/H is stable.   Assessment  & Plan :    Principal Problem:   Fracture of femoral neck, right (HCC) Active Problems:   Alzheimer's disease   Closed left hip fracture (HCC)   Essential hypertension   HLD (hyperlipidemia)   Anemia   Hip fx (HCC)   Femoral neck fracture, right, closed, initial encounter   S/P ORIF (open reduction internal fixation) fracture  Fracture of femoral neck on the right.  - Secondary to mechanical fall. Status post surgical repair -Status post hemiarthroplasty by Dr. Dion Saucier on 6/25 - will need SNF placement - Post left hip fracture status post surgical repair. Discharged from hospital 11/30/2015.  Sepsis  - Patient with significant fever over 102, significant elevation of leukocytosis at 13 K, tachycardic, blood cultures were sent, chest x-ray pending - Patient had  urine culture done at [facility] on 6/25 and growing Citrobacter koseri, pansensitive, will start on IV Rocephin, urine culture was done 6/25, faxed to [place] on 6/27. - Follow blood culture. Treat UTI (on Rocephin, and can be discharged on oral cipro). CBC in am.  Hypertension - Continue amlodipine  Anemia - Likely related to acute blood loss in setting  of recent surgery and current surgery.  - hemoglobin is 7.9 today, will transfuse 1 unit PRBC.  Dementia. - continue home meds  Hyperlipidemia. - Continue home meds  Hypokalemia - Replete    Code Status : Full  Family Communication: Husband  Disposition Plan: Likely back to SNF in am (if no recurrence of fever is noted)  Consults  :  orhto  Procedures  : Status post hemiarthroplasty by Dr. Dion Saucier on 6/25  DVT Prophylaxis  :  Lovenox   Lab Results  Component Value Date   PLT 323 12/22/2015    Antibiotics  :    Anti-infectives    Start     Dose/Rate Route Frequency Ordered Stop   12/21/15 1230  cefTRIAXone (ROCEPHIN) 1 g in dextrose 5 % 50 mL IVPB     1 g 100 mL/hr over 30 Minutes Intravenous Every 24 hours 12/21/15 1217     12/19/15 1800  ceFAZolin (ANCEF) IVPB 2g/100 mL premix     2 g 200 mL/hr over 30 Minutes Intravenous Every 6 hours 12/19/15 1504 12/20/15 0030   12/19/15 1130  ceFAZolin (ANCEF) IVPB 2g/100 mL premix     2 g 200 mL/hr over 30 Minutes Intravenous To ShortStay Procedural 12/19/15 1121 12/19/15 1215   12/19/15 1105  ceFAZolin (ANCEF) 2-4 GM/100ML-% IVPB    Comments:  Marrianne Mood   : cabinet override      12/19/15 1105 12/19/15 2314        Objective:   Filed Vitals:   12/22/15 0057 12/22/15 0305 12/22/15 0517 12/22/15 1512  BP: 142/62 122/96 137/99 122/86  Pulse: 91 75 90 84  Temp: 99 F (37.2 C) 98.2 F (36.8 C) 97.5 F (36.4 C) 97.8 F (36.6 C)  TempSrc: Oral Oral Axillary Axillary  Resp:    16  SpO2: 95% 95% 94% 96%    Wt Readings from Last 3 Encounters:  11/26/15 60.1  kg (132 lb 7.9 oz)     Intake/Output Summary (Last 24 hours) at 12/22/15 1619 Last data filed at 12/22/15 0930  Gross per 24 hour  Intake    460 ml  Output    601 ml  Net   -141 ml     Physical Exam  Awake Alert,  Supple Neck,No JVD,  Symmetrical Chest wall movement, Good air movement bilaterally, CTAB RRR,No Gallops,Rubs or new Murmurs, No Parasternal Heave +ve B.Sounds, Abd Soft, No tenderness, No Cyanosis, Clubbing or edema, No new Rash or bruise     Data Review:    CBC  Recent Labs Lab 12/19/15 1007 12/20/15 0521 12/21/15 0516 12/22/15 0753  WBC 13.7* 11.8* 13.4* 11.6*  HGB 9.8* 8.6* 7.9* 8.9*  HCT 32.2* 27.9* 25.5* 27.5*  PLT 527* 431* 371 323  MCV 89.2 89.7 88.9 88.4  MCH 27.1 27.7 27.5 28.6  MCHC 30.4 30.8 31.0 32.4  RDW 14.0 14.2 14.1 14.0    Chemistries   Recent Labs Lab 12/19/15 1007 12/20/15 0521 12/21/15 0516 12/22/15 0753  NA 138 138 137 140  K 4.0 4.0 3.6 3.3*  CL 108 106 107 110  CO2 GLUCOSE 114* 146* 130* 121*  BUN CREATININE 0.67 0.74 0.70 0.54  CALCIUM 8.7* 8.4* 8.3* 8.1*   ------------------------------------------------------------------------------------------------------------------ No results for input(s): CHOL, HDL, LDLCALC, TRIG, CHOLHDL, LDLDIRECT in the last 72 hours.  No results found for: HGBA1C ------------------------------------------------------------------------------------------------------------------ No results for input(s): TSH, T4TOTAL, T3FREE, THYROIDAB in the last 72 hours.  Invalid  input(s): FREET3 ------------------------------------------------------------------------------------------------------------------ No results for input(s): VITAMINB12, FOLATE, FERRITIN, TIBC, IRON, RETICCTPCT in the last 72 hours.  Coagulation profile No results for input(s): INR, PROTIME in the last 168 hours.  No results for input(s): DDIMER in the last 72 hours.  Cardiac Enzymes No results  for input(s): CKMB, TROPONINI, MYOGLOBIN in the last 168 hours.  Invalid input(s): CK ------------------------------------------------------------------------------------------------------------------ No results found for: BNP  Inpatient Medications  Scheduled Meds: . amLODipine  5 mg Oral Daily  . baclofen  10 mg Oral TID  . cefTRIAXone (ROCEPHIN)  IV  1 g Intravenous Q24H  . cholecalciferol  2,000 Units Oral Daily  . docusate sodium  100 mg Oral BID  . enoxaparin (LOVENOX) injection  40 mg Subcutaneous Q24H  . ferrous sulfate  325 mg Oral TID PC  . FLUoxetine  20 mg Oral Daily  . magnesium gluconate  500 mg Oral Daily  . potassium chloride SA  40 mEq Oral Once  . pravastatin  20 mg Oral Daily  . QUEtiapine  12.5 mg Oral QHS  . rivastigmine  6 mg Oral BID  . senna  1 tablet Oral BID  . vitamin B-12  1,000 mcg Oral Daily   Continuous Infusions: . sodium chloride 50 mL/hr at 12/21/15 1230  . lactated ringers 50 mL/hr at 12/19/15 1057   PRN Meds:.acetaminophen **OR** acetaminophen, alum & mag hydroxide-simeth, bisacodyl, HYDROcodone-acetaminophen, LORazepam, magnesium citrate, menthol-cetylpyridinium **OR** phenol, morphine injection, ondansetron **OR** ondansetron (ZOFRAN) IV, polyethylene glycol  Micro Results Recent Results (from the past 240 hour(s))  Surgical pcr screen     Status: None   Collection Time: 12/19/15 10:14 AM  Result Value Ref Range Status   MRSA, PCR NEGATIVE NEGATIVE Final   Staphylococcus aureus NEGATIVE NEGATIVE Final    Comment:        The Xpert SA Assay (FDA approved for NASAL specimens in patients over 80 years of age), is one component of a comprehensive surveillance program.  Test performance has been validated by [facility] for patients greater than or equal to 80 year old. It is not intended to diagnose infection nor to guide or monitor treatment.   Culture, blood (Routine X 2) w Reflex to ID Panel     Status: None (Preliminary result)     Collection Time: 12/21/15 11:20 AM  Result Value Ref Range Status   Specimen Description BLOOD RIGHT ANTECUBITAL  Final   Special Requests IN PEDIATRIC BOTTLE  2CC  Final   Culture NO GROWTH 1 DAY  Final   Report Status PENDING  Incomplete  Culture, blood (Routine X 2) w Reflex to ID Panel     Status: None (Preliminary result)   Collection Time: 12/21/15 11:25 AM  Result Value Ref Range Status   Specimen Description BLOOD RIGHT HAND  Final   Special Requests BOTTLES DRAWN AEROBIC ONLY 10CC  Final   Culture NO GROWTH 1 DAY  Final   Report Status PENDING  Incomplete    Radiology Reports Dg Chest 1 View  11/25/2015  CLINICAL DATA:  Status post fall, with left hip deformity. Concern for chest injury. Initial encounter. EXAM: CHEST 1 VIEW COMPARISON:  None. FINDINGS: The lungs are mildly hyperexpanded. Minimal bilateral atelectasis or scarring is noted. There is no evidence of pleural effusion or pneumothorax. The cardiomediastinal silhouette is borderline normal. No acute osseous abnormalities are seen. IMPRESSION: Minimal bilateral atelectasis or scarring noted. Lungs otherwise clear. Electronically Signed   By: Roanna RaiderJeffery  Chang M.D.   On: 11/25/2015 23:09  Ct Head Wo Contrast  11/26/2015  CLINICAL DATA:  Altered mental status.  Confusion and combative. EXAM: CT HEAD WITHOUT CONTRAST TECHNIQUE: Contiguous axial images were obtained from the base of the skull through the vertex without intravenous contrast. COMPARISON:  None. FINDINGS: Brain: Generalized cerebral atrophy, mild- moderate chronic small vessel ischemia.No intracranial hemorrhage, mass effect, or midline shift. No hydrocephalus. The basilar cisterns are patent. No evidence of territorial infarct. No intracranial fluid collection. Vascular: No hyperdense vessel or abnormal calcification. Atherosclerosis of skullbase vasculature. Skull:  Calvarium is intact. Sinuses/Orbits: Minimal mucosal thickening of the inferior right maxillary sinus.  Paranasal sinuses are otherwise clear. Mastoid air cells are well aerated. Other: None. IMPRESSION: Atrophy and chronic small vessel ischemia, no acute intracranial abnormality. Electronically Signed   By: Rubye OaksMelanie  Ehinger M.D.   On: 11/26/2015 03:45   Pelvis Portable  11/26/2015  CLINICAL DATA:  Bipolar left hip arthroplasty EXAM: PORTABLE PELVIS 1-2 VIEWS COMPARISON:  None. FINDINGS: Single view shows bipolar hip arthroplasty on the left. Components appear well positioned. No radiographically detectable complication. IMPRESSION: Good appearance following left hip arthroplasty. Electronically Signed   By: Paulina FusiMark  Shogry M.D.   On: 11/26/2015 10:34   Dg Chest Port 1 View  12/21/2015  CLINICAL DATA:  Fevers EXAM: PORTABLE CHEST 1 VIEW COMPARISON:  12/19/2015 FINDINGS: Cardiac shadow is mildly enlarged. The lungs are well aerated bilaterally. Some scarring is noted in the right lung apex laterally stable from recent CT examination. No new focal infiltrate or sizable effusion is seen. No acute bony abnormality is noted. IMPRESSION: No active disease. Electronically Signed   By: Alcide CleverMark  Lukens M.D.   On: 12/21/2015 14:16   Dg Hip Port Unilat With Pelvis 1v Right  12/19/2015  CLINICAL DATA:  Status post internal fixation of a right hip fracture. EXAM: DG HIP (WITH OR WITHOUT PELVIS) 1V PORT RIGHT COMPARISON:  None. FINDINGS: There are bilateral hip prostheses, both appearing intact and appropriately positioned. Expected postsurgical changes are seen within the soft tissues about the right hip. No evidence of surgical complicating feature. IMPRESSION: Postsurgical changes as above. Electronically Signed   By: Bary RichardStan  Maynard M.D.   On: 12/19/2015 14:41   Dg Hip Unilat With Pelvis 2-3 Views Left  11/25/2015  CLINICAL DATA:  Status post unwitnessed fall, with left hip pain. Initial encounter. EXAM: DG HIP (WITH OR WITHOUT PELVIS) 2-3V LEFT COMPARISON:  None. FINDINGS: There is a mildly displaced subcapital fracture  through the left femoral neck. The left femoral head remains seated at the acetabulum. The right hip joint is grossly unremarkable. No significant degenerative change is appreciated. The sacroiliac joints are unremarkable in appearance. The visualized bowel gas pattern is grossly unremarkable in appearance. IMPRESSION: Mildly displaced subcapital fracture through the left femoral neck. Electronically Signed   By: Roanna RaiderJeffery  Chang M.D.   On: 11/25/2015 23:08    Time Spent in minutes  25 minutes   Barnetta ChapelSylvester I Vermell Madrid M.D on 12/22/2015 at 4:19 PM  Between 7am to 7pm - Pager - [phone]  After 7pm go to [email or web address] - password [email or web address]  [facility] -  Office  [phone]

## 2015-12-22 NOTE — Progress Notes (Signed)
Physical Therapy Treatment Patient Details Name: Riki RuskLois Ballen MRN: [id number] DOB: 1933-11-17 Today's Date: 12/22/2015    History of Present Illness Pt is an 82 y/o female with a PMH of dementia, HTN, hyperlipidemia, recent L hip fracture with surgical repair 6/2. Pt returns from SNF with new R hip fx s/p fall x2 and is now s/p R hip hemiarthroplasty on 12/19/15. Pt is WBAT bilaterally and has active posterior hip precautions.    PT Comments    Pt progressing slowly towards physical therapy goals. Was pleasantly confused throughout session however pt became very anxious with mobility due to pain and decreased orientation to situation/why her hip was hurting. Pt somewhat agitated by end of session. Pt was set up in recliner with NT present for breakfast. Will continue to follow.   Follow Up Recommendations  SNF;Supervision/Assistance - 24 hour     Equipment Recommendations  None recommended by PT (TBD by next venue of care)    Recommendations for Other Services       Precautions / Restrictions Precautions Precautions: Posterior Hip;Fall Precaution Booklet Issued: Yes (comment) Precaution Comments: pt with dementia and unable to understand any hip precautions. has abducter wedge in place Restrictions Weight Bearing Restrictions: Yes RLE Weight Bearing: Weight bearing as tolerated LLE Weight Bearing: Weight bearing as tolerated    Mobility  Bed Mobility Overal bed mobility: Needs Assistance;+2 for physical assistance Bed Mobility: Supine to Sit;Sit to Supine     Supine to sit: Max assist;+2 for physical assistance     General bed mobility comments: Pt in high degree of pain, max encouragement given. HOB elevated, used pads to scoot pt to EOB  Transfers Overall transfer level: Needs assistance Equipment used: 2 person hand held assist Transfers: Sit to/from [employer];Stand Pivot Transfers Sit to Stand: +2 physical assistance;From elevated surface;Max assist Stand pivot  transfers: Max assist;+2 physical assistance       General transfer comment: Bed height elevated. Pt was able to assist with power-up to full stand with +2 required for safe transition to recliner. Once pt sitting [facility] felt she needed to have a bowel movement. [facility] with min assist +1 but then became very anxious with pain and again required +2 max for transition back to the recliner.   Ambulation/Gait             General Gait Details: Unable at this time.    Stairs            Wheelchair Mobility    Modified Rankin (Stroke Patients Only)       Balance Overall balance assessment: History of Falls Sitting-balance support: Feet supported;No upper extremity supported Sitting balance-Leahy Scale: Poor Sitting balance - Comments: Hands-on assist required at all times to maintain sitting balance.    Standing balance support: Bilateral upper extremity supported Standing balance-Leahy Scale: Zero Standing balance comment: Requires +2 assist at this time.                     Cognition Arousal/Alertness: Awake/alert Behavior During Therapy: WFL for tasks assessed/performed Overall Cognitive Status: History of cognitive impairments - at baseline Area of Impairment: Orientation;Memory;Following commands;Safety/judgement Orientation Level: Disoriented to;Place;Time;Situation   Memory: Decreased recall of precautions;Decreased short-term memory Following Commands: Follows one step commands inconsistently Safety/Judgement: Decreased awareness of safety;Decreased awareness of deficits          Exercises General Exercises - Lower Extremity Ankle Circles/Pumps: 15 reps    General Comments General  comments (skin integrity, edema, etc.): Pt unable to follow commands for further therapeutic exercise. Appeared to become somewhat agitated towards end of session due to pain and anxiety with mobility.      Pertinent Vitals/Pain  Pain Assessment: Faces Faces Pain Scale: Hurts whole lot Pain Location: R hip with mobility Pain Descriptors / Indicators: Operative site guarding Pain Intervention(s): Limited activity within patient's tolerance;Monitored during session;Repositioned    Home Living                      Prior Function            PT Goals (current goals can now be found in the care plan section) Acute Rehab PT Goals Patient Stated Goal: none stated PT Goal Formulation: Patient unable to participate in goal setting Time For Goal Achievement: 01/03/16 Potential to Achieve Goals: Fair    Frequency  Min 3X/week    PT Plan      Co-evaluation             End of Session Equipment Utilized During Treatment: Gait belt Activity Tolerance: Patient limited by pain Patient left: in chair;with chair alarm set;with call bell/phone within reach (Abduction wedge in place)     Time: [id number] PT Time Calculation (min) (ACUTE ONLY): 33 min  Charges:  $Gait Training: 23-37 mins                    G Codes:      Conni SlipperKirkman, Katessa Attridge 12/22/2015, 9:51 AM  Conni SlipperLaura Oziel Beitler, PT, DPT Acute Rehabilitation Services Pager: [phone]

## 2015-12-22 NOTE — Care Management Important Message (Signed)
Important Message  Patient Details  Name: Kristen Nguyen MRN: [id number] Date of Birth: 15-Feb-1934   Medicare Important Message Given:  Yes    Bernadette HoitShoffner, Cannon Arreola Coleman 12/22/2015, 10:13 AM

## 2015-12-22 NOTE — Progress Notes (Signed)
Patient ID: [name], female   DOB: September 19, 1933, 82 y.o.   MRN: [id number]     Subjective:  Patient reports pain as mild.  Patient very alert and happy to see you.  Not aware of time and place but does follow commands and denies any CP or SOB  Objective:   VITALS:   Filed Vitals:   12/22/15 0025 12/22/15 0057 12/22/15 0305 12/22/15 0517  BP: 123/48 142/62 122/96 137/99  Pulse: 83 91 75 90  Temp: 99.1 F (37.3 C) 99 F (37.2 C) 98.2 F (36.8 C) 97.5 F (36.4 C)  TempSrc: Oral Oral Oral Axillary  Resp: 16     SpO2: 92% 95% 95% 94%    ABD soft Sensation intact distally Dorsiflexion/Plantar flexion intact Incision: dressing C/D/I and no drainage Wounds look good and no sign of infection   Lab Results  Component Value Date   WBC 13.4* 12/21/2015   HGB 7.9* 12/21/2015   HCT 25.5* 12/21/2015   MCV 88.9 12/21/2015   PLT 371 12/21/2015   BMET    Component Value Date/Time   NA 137 12/21/2015 0516   K 3.6 12/21/2015 0516   CL 107 12/21/2015 0516   CO2 25 12/21/2015 0516   GLUCOSE 130* 12/21/2015 0516   BUN 11 12/21/2015 0516   CREATININE 0.70 12/21/2015 0516   CALCIUM 8.3* 12/21/2015 0516   GFRNONAA >60 12/21/2015 0516   GFRAA >60 12/21/2015 0516     Assessment/Plan: 3 Days Post-Op   Principal Problem:   Fracture of femoral neck, right (HCC) Active Problems:   Alzheimer's disease   Closed left hip fracture (HCC)   Essential hypertension   HLD (hyperlipidemia)   Anemia   Hip fx (HCC)   Femoral neck fracture, right, closed, initial encounter   S/P ORIF (open reduction internal fixation) fracture   Advance diet Up with therapy ABLA continue to monitor  Continue plan per medicine Dry dressing PRN WBAT    Kristen Nguyen, Kristen Nguyen 12/22/2015, 7:40 AM  Discussed and agree with above.    Kristen LucyJoshua Maleak Brazzel, MD Cell [phone]

## 2015-12-23 DIAGNOSIS — G308 Other Alzheimer's disease: Secondary | ICD-10-CM

## 2015-12-23 DIAGNOSIS — F0281 Dementia in other diseases classified elsewhere with behavioral disturbance: Secondary | ICD-10-CM

## 2015-12-23 LAB — RENAL FUNCTION PANEL
Albumin: 2 g/dL — ABNORMAL LOW (ref 3.5–5.0)
Anion gap: 6 (ref 5–15)
BUN: 13 mg/dL (ref 6–20)
CO2: 26 mmol/L (ref 22–32)
Calcium: 8.2 mg/dL — ABNORMAL LOW (ref 8.9–10.3)
Chloride: 110 mmol/L (ref 101–111)
Creatinine, Ser: 0.58 mg/dL (ref 0.44–1.00)
GFR calc Af Amer: >60 mL/min (ref >60)
GFR calc non Af Amer: >60 mL/min (ref >60)
Glucose, Bld: 109 mg/dL — ABNORMAL HIGH (ref 65–99)
Phosphorus: 3.4 mg/dL (ref 2.5–4.6)
Potassium: 3.1 mmol/L — ABNORMAL LOW (ref 3.5–5.1)
Sodium: 142 mmol/L (ref 135–145)

## 2015-12-23 LAB — CBC WITH DIFFERENTIAL/PLATELET
Basophils Absolute: 0 10*3/uL (ref 0.0–0.1)
Basophils Relative: 0 %
Eosinophils Absolute: 0.5 10*3/uL (ref 0.0–0.7)
Eosinophils Relative: 5 %
HCT: 28.9 % — ABNORMAL LOW (ref 36.0–46.0)
Hemoglobin: 9.1 g/dL — ABNORMAL LOW (ref 12.0–15.0)
Lymphocytes Relative: 17 %
Lymphs Abs: 1.7 10*3/uL (ref 0.7–4.0)
MCH: 28.5 pg (ref 26.0–34.0)
MCHC: 31.5 g/dL (ref 30.0–36.0)
MCV: 90.6 fL (ref 78.0–100.0)
Monocytes Absolute: 0.8 10*3/uL (ref 0.1–1.0)
Monocytes Relative: 8 %
Neutro Abs: 7.1 10*3/uL (ref 1.7–7.7)
Neutrophils Relative %: 70 %
Platelets: 392 10*3/uL (ref 150–400)
RBC: 3.19 MIL/uL — ABNORMAL LOW (ref 3.87–5.11)
RDW: 14.3 % (ref 11.5–15.5)
WBC: 10.2 10*3/uL (ref 4.0–10.5)

## 2015-12-23 LAB — TYPE AND SCREEN
ABO/RH(D): AB POS
Antibody Screen: NEGATIVE
Unit division: 0

## 2015-12-23 MED ORDER — CIPROFLOXACIN HCL 500 MG PO TABS: 500.0000 mg | ORAL_TABLET | Freq: Two times a day (BID) | ORAL | Status: AC

## 2015-12-23 MED ORDER — POLYETHYLENE GLYCOL 3350 17 G PO PACK: 17.0000 g | PACK | Freq: Every day | ORAL | Status: AC | PRN

## 2015-12-23 MED ORDER — BISACODYL 10 MG RE SUPP: 10.0000 mg | Freq: Every day | RECTAL | Status: AC | PRN

## 2015-12-23 MED ORDER — LORAZEPAM 1 MG PO TABS: 0.5000 mg | ORAL_TABLET | Freq: Two times a day (BID) | ORAL | Status: AC | PRN

## 2015-12-23 MED ORDER — POTASSIUM CHLORIDE 20 MEQ PO PACK
40.0000 meq | PACK | Freq: Once | ORAL | Status: AC
  Administered 2015-12-23: 40 meq via ORAL
  Filled 2015-12-23: qty 2

## 2015-12-23 MED ORDER — FERROUS SULFATE 325 (65 FE) MG PO TABS: 325.0000 mg | ORAL_TABLET | Freq: Two times a day (BID) | ORAL | Status: AC

## 2015-12-23 NOTE — Progress Notes (Signed)
Pt discharge education and instructions completed. [facility]. Pt discharge to SNF with [facility] to transport off to disposition. Pt IV removed; pt notified of pt loose stools but d/t pt receive laxative; no new orders received. Pt awaiting on [facility] to transport of to disposition. Will continue to monitor till pt picked up. P. Seleta RhymesAmo Patirica Longshore RN

## 2015-12-23 NOTE — Discharge Summary (Signed)
Physician Discharge Summary  Patient ID: Kristen Nguyen MRN: [id number] DOB/AGE: [birth year] 82 y.o.  Admit date: 12/19/2015 Discharge date: 12/23/2015  Admission Diagnoses:  Discharge Diagnoses:  Principal Problem:   Fracture of femoral neck, right (HCC) Active Problems:   Alzheimer's disease   Closed left hip fracture (HCC)   Essential hypertension   HLD (hyperlipidemia)   Anemia   Hip fx (HCC)   Femoral neck fracture, right, closed, initial encounter   S/P ORIF (open reduction internal fixation) fracture   Discharged Condition: stable  Hospital Course: 82 y.o. female with medical history significant for dementia, htn, hyperlipidemia recent left hip fracture with surgical repair (discharged on 11/30/2015), re-presented with right hip fracture following a mechanical fall at rehab facility. Reportedly, patient sustained two falls at the facility, with the second fall resulting in right hip pain. [facility] and [facility] Patient was admitted for further assessment and management. Patient underwent right hip hemiarthroplasty on 12/19/2015. Post op period was significant for fever (Temp of 102F). Urine culture done prior to admission grew out citrobacter. Patient was managed with IV rocephin during the hospital stay and will be discharged on oral ciprofloxacin. Fever has resolved.  Consults: Orthopedics  Significant Diagnostic Studies: Urine culture done at an outside facility grew Citrobacter  Discharge Medication - Please see Med Rec.  Discharge Exam: Blood pressure 136/60, pulse 81, temperature 98.8 F (37.1 C), temperature source Oral, resp. rate 18, SpO2 97 %.  Disposition: 03-Skilled Nursing Facility  Discharge Instructions    Diet - low sodium heart healthy    Complete by:  As directed      Discharge instructions    Complete by:  As directed   Check BMP in 2 days. Follow up with PCP within one week. Follow up with  Orthopedic team as soon as possible     Increase activity slowly    Complete by:  As directed   Activity as per rehab team     Posterior total hip precautions    Complete by:  As directed      Weight bearing as tolerated    Complete by:  As directed             Medication List    STOP taking these medications        aspirin EC 81 MG tablet      TAKE these medications        amLODipine 5 MG tablet  Commonly known as:  NORVASC  Take 5 mg by mouth daily.     baclofen 10 MG tablet  Commonly known as:  LIORESAL  Take 1 tablet (10 mg total) by mouth 3 (three) times daily. As needed for muscle spasm     bisacodyl 10 MG suppository  Commonly known as:  DULCOLAX  Place 1 suppository (10 mg total) rectally daily as needed for moderate constipation.     calcium-vitamin D 500-200 MG-UNIT tablet  Commonly known as:  OSCAL WITH D  Take 1 tablet by mouth 2 (two) times daily.     cholecalciferol 1000 units tablet  Commonly known as:  VITAMIN D  Take 2,000 Units by mouth daily.     ciprofloxacin 500 MG tablet  Commonly known as:  CIPRO  Take 1 tablet (500 mg total) by mouth 2 (two) times daily.     docusate sodium 100 MG capsule  Commonly known as:  COLACE  Take 100 mg by mouth daily.  enoxaparin 40 MG/0.4ML injection  Commonly known as:  LOVENOX  Inject 40 mg into the skin daily.     ferrous sulfate 325 (65 FE) MG tablet  Take 1 tablet (325 mg total) by mouth 2 (two) times daily with a meal.     FLUoxetine 20 MG tablet  Commonly known as:  PROZAC  Take 20 mg by mouth daily.     HYDROcodone-acetaminophen 5-325 MG tablet  Commonly known as:  NORCO  Take 1-2 tablets by mouth every 6 (six) hours as needed for moderate pain. MAXIMUM TOTAL ACETAMINOPHEN DOSE IS 4000 MG PER DAY     LORazepam 1 MG tablet  Commonly known as:  ATIVAN  Take 0.5 tablets (0.5 mg total) by mouth 2 (two) times daily as needed for anxiety.     magnesium gluconate 500 MG tablet  Commonly known  as:  MAGONATE  Take 500 mg by mouth daily.     multivitamin with minerals tablet  Take 1 tablet by mouth daily.     polyethylene glycol packet  Commonly known as:  MIRALAX / GLYCOLAX  Take 17 g by mouth daily as needed for mild constipation.     pravastatin 20 MG tablet  Commonly known as:  PRAVACHOL  Take 20 mg by mouth daily.     QUEtiapine 25 MG tablet  Commonly known as:  SEROQUEL  Take 0.5 tablets (12.5 mg total) by mouth at bedtime.     rivastigmine 6 MG capsule  Commonly known as:  EXELON  Take 6 mg by mouth 2 (two) times daily.     sennosides-docusate sodium 8.6-50 MG tablet  Commonly known as:  SENOKOT-S  Take 2 tablets by mouth daily.     vitamin B-12 1000 MCG tablet  Commonly known as:  CYANOCOBALAMIN  Take 1,000 mcg by mouth daily.     vitamin C 500 MG tablet  Commonly known as:  ASCORBIC ACID  Take 500 mg by mouth daily.           Follow-up Information    Follow up with Eulas PostLANDAU,JOSHUA P, MD. Schedule an appointment as soon as possible for a visit in 2 weeks.   Specialty:  Orthopedic Surgery   Contact information:   [address] [place] [address] [phone]       Signed: Barnetta ChapelSylvester I Aja Bolander 12/23/2015, 11:16 AM

## 2015-12-23 NOTE — Progress Notes (Signed)
Pt IV removed; pt hip incision dsg remains clean, dry and intact. Pt picked up by [facility] to be transported off to disposition. Pt transported off unit via stretcher with belongings and spouse at side. Pt changed into clean gown; pt pre-medicated prior to transfer upon husband request. Dionne BucyP. Amo Jantzen Pilger RN.

## 2015-12-23 NOTE — Clinical Social Work Note (Signed)
Patient will discharge today per MD order. [facility] ([facility]) RN to call report prior to transportation to: [phone] Transportation: [facility]- transport to be arranged after insurance authorization has been received from [employer].  CSW sent discharge summary to SNF for review.  Husband is aware of today's discharge.  Vickii PennaGina Akaiya Touchette, LCSW [phone]  5N1-9, 2S 15-16 and Psychiatric Service Line  Licensed Clinical Social Worker

## 2015-12-26 LAB — CULTURE, BLOOD (ROUTINE X 2)
CULTURE: NO GROWTH
Culture: NO GROWTH

## 2016-03-26 DEATH — deceased

## 2017-10-24 IMAGING — CT CT HEAD W/O CM
3 of 4 series · 17 of 47 positions shown, 20 images · non-contrast
Comparison: None.

CLINICAL DATA: Altered mental status.  Confusion and combative.

EXAM:
CT HEAD WITHOUT CONTRAST
TECHNIQUE: Contiguous axial images were obtained from the base of the skull
through the vertex without intravenous contrast.

[Series 201: head w/o, idose (1) · axial · non-contrast · 0.49mm/px · z∈[+957,+1092]mm · 11 of 33 slices shown, 14 images]
[im 3/33  brain]
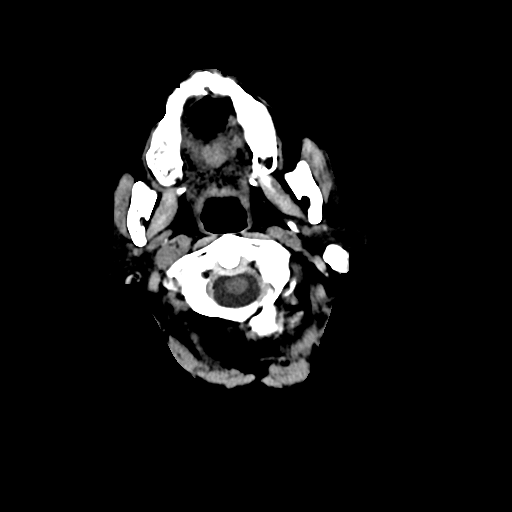
[im 3/33  bone]
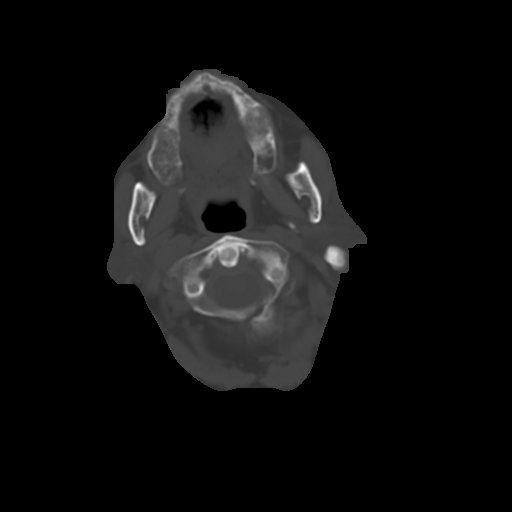
[im 5/33  brain]
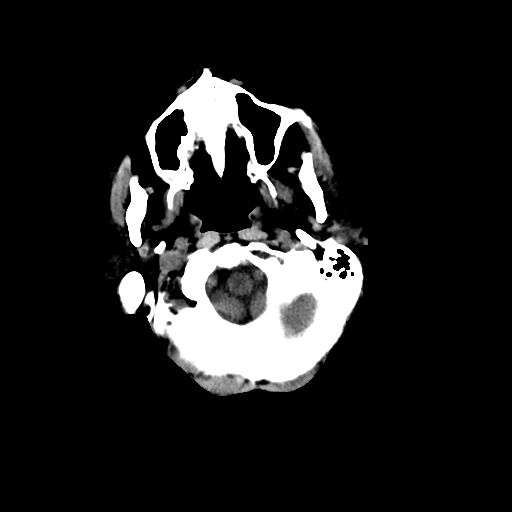
[im 7/33  brain]
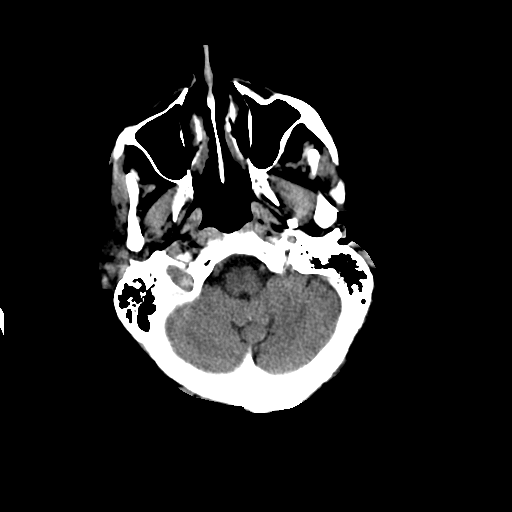
[im 12/33  brain]
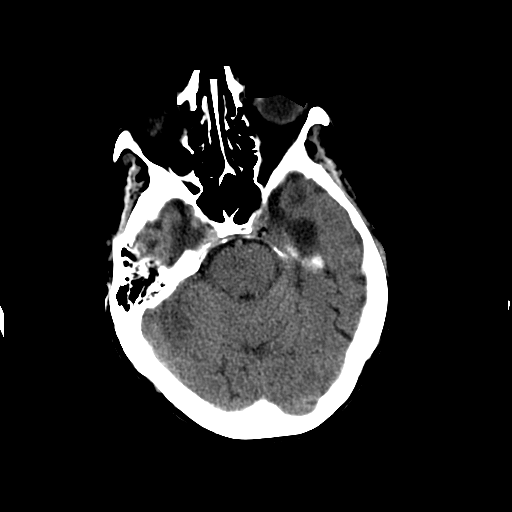
[im 14/33  brain]
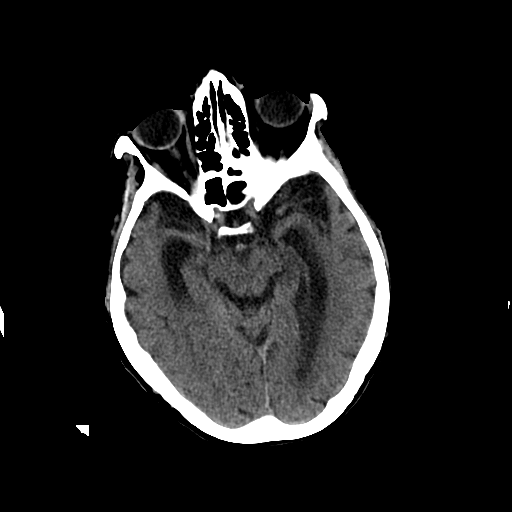
[im 14/33  bone]
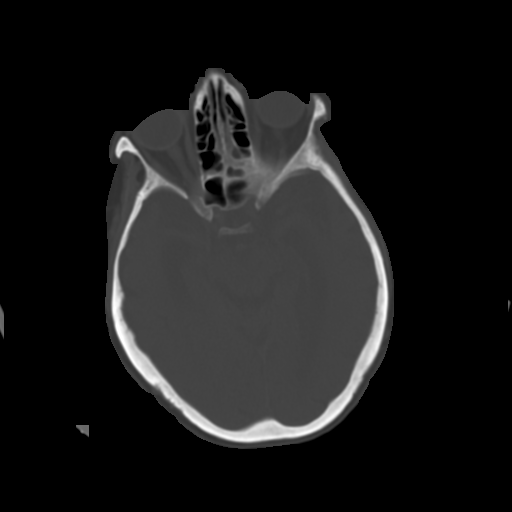
[im 17/33  brain]
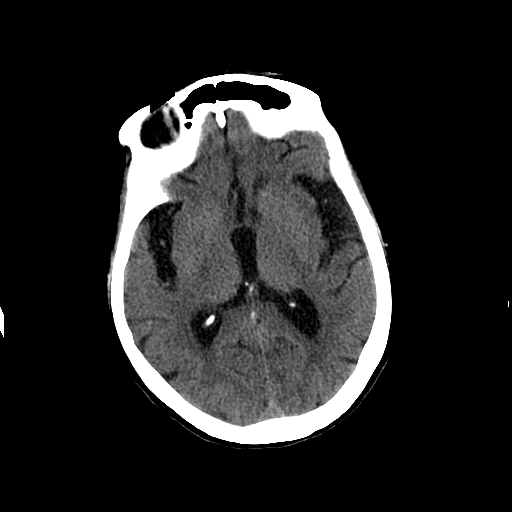
[im 19/33  brain]
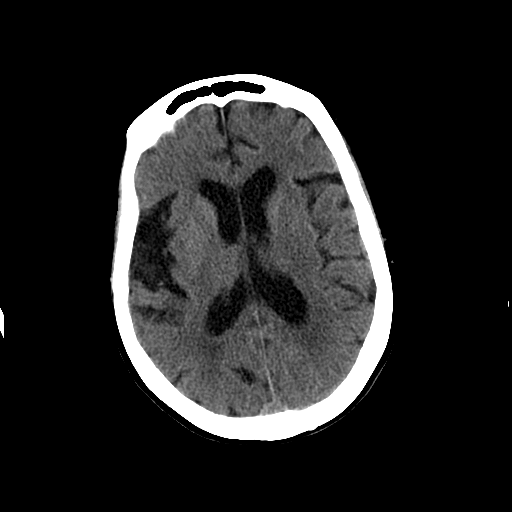
[im 21/33  brain]
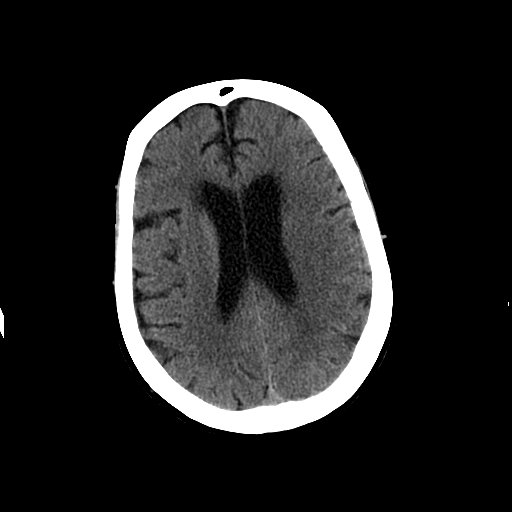
[im 26/33  brain]
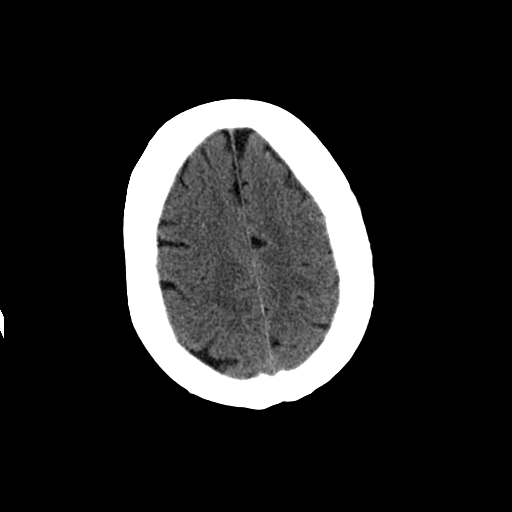
[im 26/33  bone]
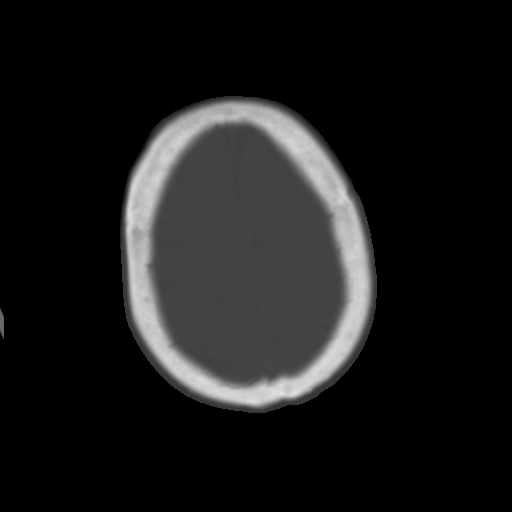
[im 28/33  brain]
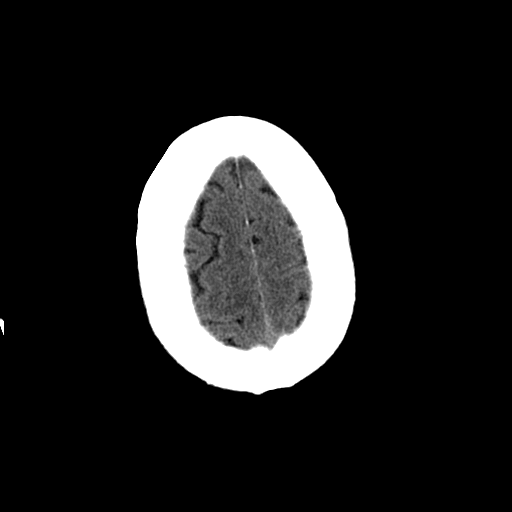
[im 30/33  brain]
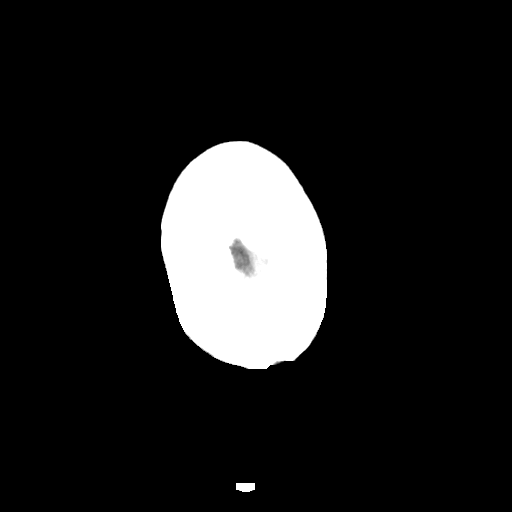

[Series 203: coronal st, idose (1) · coronal · 0.40mm/px · 3 of 71 slices shown]
[im 24/71  brain]
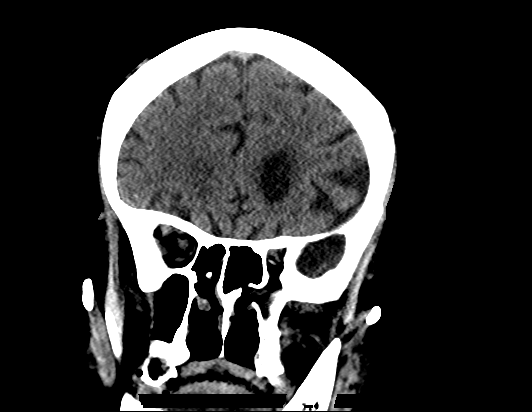
[im 32/71  brain]
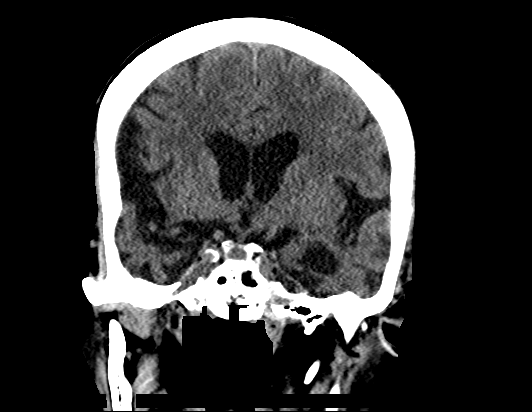
[im 39/71  brain]
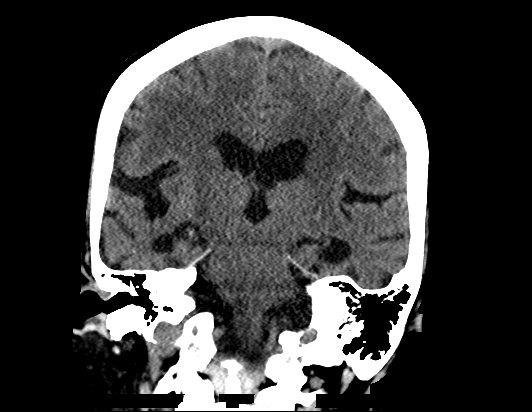

[Series 204: sagittal st, idose (1) · sagittal · 0.40mm/px · 3 of 76 slices shown]
[im 26/76  brain]
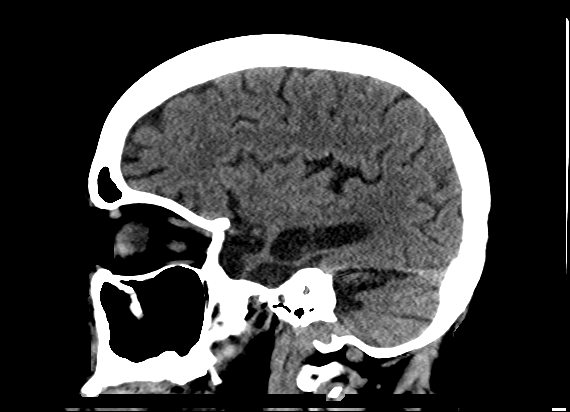
[im 38/76  brain]
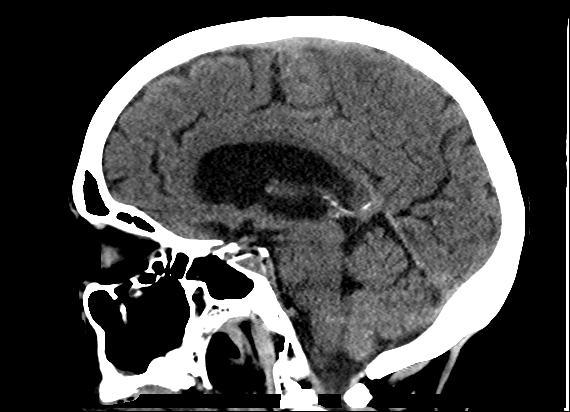
[im 51/76  brain]
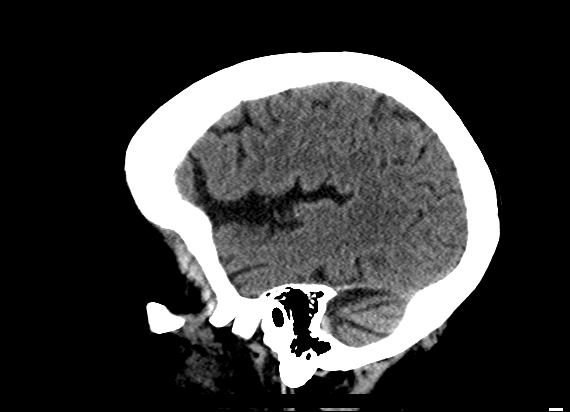

[17 of 47 positions shown; findings below may reference images not displayed]

FINDINGS: Brain: Generalized cerebral atrophy, mild- moderate chronic small
vessel ischemia.No intracranial hemorrhage, mass effect, or midline
shift. No hydrocephalus. The basilar cisterns are patent. No
evidence of territorial infarct. No intracranial fluid collection.

Vascular: No hyperdense vessel or abnormal calcification.
Atherosclerosis of skullbase vasculature.

Skull:  Calvarium is intact.

Sinuses/Orbits: Minimal mucosal thickening of the inferior right
maxillary sinus. Paranasal sinuses are otherwise clear. Mastoid air
cells are well aerated.

Other: None.
IMPRESSION: Atrophy and chronic small vessel ischemia, no acute intracranial
abnormality.
